# Patient Record
Sex: Female | Born: 2000 | Race: White | Hispanic: Yes | Marital: Single | Smoking: Never smoker
Health system: Southern US, Community
[De-identification: ages and names within clinical notes are randomized; demographics above are authoritative.]

## PROBLEM LIST (undated history)

## (undated) ENCOUNTER — Inpatient Hospital Stay (HOSPITAL_COMMUNITY): Payer: Self-pay

## (undated) DIAGNOSIS — F32A Depression, unspecified: Secondary | ICD-10-CM

## (undated) DIAGNOSIS — N390 Urinary tract infection, site not specified: Secondary | ICD-10-CM

## (undated) DIAGNOSIS — N760 Acute vaginitis: Secondary | ICD-10-CM

## (undated) DIAGNOSIS — B9689 Other specified bacterial agents as the cause of diseases classified elsewhere: Secondary | ICD-10-CM

## (undated) DIAGNOSIS — S42309A Unspecified fracture of shaft of humerus, unspecified arm, initial encounter for closed fracture: Secondary | ICD-10-CM

## (undated) DIAGNOSIS — A749 Chlamydial infection, unspecified: Secondary | ICD-10-CM

## (undated) HISTORY — PX: WISDOM TOOTH EXTRACTION: SHX21

## (undated) HISTORY — PX: TYMPANOSTOMY TUBE PLACEMENT: SHX32

## (undated) HISTORY — DX: Depression, unspecified: F32.A

---
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
## 2019-02-28 ENCOUNTER — Ambulatory Visit: Payer: Medicaid Other | Attending: Internal Medicine

## 2019-02-28 DIAGNOSIS — Z20822 Contact with and (suspected) exposure to covid-19: Secondary | ICD-10-CM | POA: Diagnosis not present

## 2019-03-01 LAB — NOVEL CORONAVIRUS, NAA: SARS-CoV-2, NAA: NOT DETECTED

## 2019-03-07 ENCOUNTER — Ambulatory Visit: Payer: Medicaid Other | Attending: Internal Medicine

## 2019-03-07 DIAGNOSIS — Z20822 Contact with and (suspected) exposure to covid-19: Secondary | ICD-10-CM

## 2019-03-08 LAB — NOVEL CORONAVIRUS, NAA: SARS-CoV-2, NAA: NOT DETECTED

## 2019-06-30 ENCOUNTER — Encounter: Payer: Self-pay | Admitting: Pediatrics

## 2019-06-30 ENCOUNTER — Other Ambulatory Visit (HOSPITAL_COMMUNITY)
Admission: RE | Admit: 2019-06-30 | Discharge: 2019-06-30 | Disposition: A | Discharge status: Home / Self Care | Payer: Medicaid Other | Source: Ambulatory Visit | Attending: Pediatrics | Admitting: Pediatrics

## 2019-06-30 ENCOUNTER — Ambulatory Visit (INDEPENDENT_AMBULATORY_CARE_PROVIDER_SITE_OTHER): Payer: Medicaid Other | Admitting: Pediatrics

## 2019-06-30 DIAGNOSIS — Z113 Encounter for screening for infections with a predominantly sexual mode of transmission: Secondary | ICD-10-CM | POA: Diagnosis not present

## 2019-06-30 DIAGNOSIS — N760 Acute vaginitis: Secondary | ICD-10-CM | POA: Diagnosis not present

## 2019-06-30 DIAGNOSIS — Z3202 Encounter for pregnancy test, result negative: Secondary | ICD-10-CM | POA: Diagnosis not present

## 2019-06-30 DIAGNOSIS — R300 Dysuria: Secondary | ICD-10-CM | POA: Diagnosis not present

## 2019-06-30 LAB — POCT URINALYSIS DIPSTICK
Bilirubin, UA: NEGATIVE
Glucose, UA: NEGATIVE
Ketones, UA: NEGATIVE
Nitrite, UA: NEGATIVE
Protein, UA: POSITIVE — AB
Spec Grav, UA: 1.01 (ref 1.010–1.025)
Urobilinogen, UA: 0.2 E.U./dL
pH, UA: 7 (ref 5.0–8.0)

## 2019-06-30 LAB — POCT URINE PREGNANCY: Preg Test, Ur: NEGATIVE

## 2019-06-30 MED ORDER — FLUCONAZOLE 150 MG PO TABS: 150.0000 mg | ORAL_TABLET | Freq: Once | ORAL | 0 refills | Status: AC

## 2019-06-30 NOTE — Progress Notes (Signed)
Subjective:     Theresa Hubbard, is a 18 y.o. female  HPI  Chief Complaint  Patient presents with  . Vaginal Itching    x 2 weeks    Current illness: Complains of burning and itching in vaginal area for 2 weeks Had painful in the course yesterday with small drops of serosanguineous discharge noted. Her typical discharge is thicker and more yellow with a small smell She does not have dysuria She has had the same sexual partner for 2 years her partner was checked and was negative for STIs about 2 weeks ago. She does not use condoms.  About 1 year ago she was treated twice for chlamydia the symptoms seem different than that time She also was pregnant and had a spontaneous abortion about 1 year ago.  She now has a Nexplanon  Fever: No  Vomiting: No Diarrhea: No Other symptoms such as sore throat or Headache?:  No No back pain Appetite  decreased?:  No Urine Output decreased?:  No  Review of Systems  History and Problem List: Theresa Hubbard has Acne vulgaris; Left carpal tunnel syndrome; and Irregular menstruation on their problem list.  Theresa Hubbard  has a past medical history of Broken arm, BV (bacterial vaginosis), Chlamydia, and UTI (urinary tract infection).  The following portions of the patient's history were reviewed and updated as appropriate: allergies, current medications, past family history, past medical history, past social history, past surgical history and problem list.     Objective:     There were no vitals taken for this visit.   Physical Exam Vitals and nursing note reviewed.  Constitutional:      General: She is not in acute distress. HENT:     Head: Normocephalic and atraumatic.     Right Ear: External ear normal.     Left Ear: External ear normal.     Nose: Nose normal.  Eyes:     General:        Right eye: No discharge.        Left eye: No discharge.     Conjunctiva/sclera: Conjunctivae normal.  Cardiovascular:     Rate and Rhythm:  Normal rate and regular rhythm.     Heart sounds: Normal heart sounds.  Pulmonary:     Effort: No respiratory distress.     Breath sounds: No wheezing or rales.  Abdominal:     General: There is no distension.     Palpations: Abdomen is soft.     Tenderness: There is no abdominal tenderness. There is no right CVA tenderness or left CVA tenderness.  Genitourinary:    Comments: Mild erythema over vulvar area no significant discharge noted, thin white plaque at introitus.  No papules no pustules no vesicles Musculoskeletal:     Cervical back: Normal range of motion.  Skin:    General: Skin is warm and dry.     Findings: No rash.        Assessment & Plan:   1. Acute vaginitis  Consider STI, yeast and bacterial vaginosis. She is well without fever.  There is no indication for hospitalization  - fluconazole (DIFLUCAN) 150 MG tablet; Take 1 tablet (150 mg total) by mouth once for 1 dose.  Dispense: 1 tablet; Refill: 0 - WET PREP BY MOLECULAR PROBE  2. Routine screening for STI (sexually transmitted infection)  - Urine cytology ancillary only  3. Dysuria  - POCT urinalysis dipstick--trace leukocytes - POCT urine pregnancy--negative  Treat empirically for yeast Discussed options to  treat empirically for STI.  She chooses to await results which is a reasonable option in this situation  Patient's phone number is [phone] for results  Supportive care and return precautions reviewed.  Spent  30  minutes completing face to face time with patient; counseling regarding diagnosis and treatment plan, chart review, care coordination and documentation.   Roselind Messier, MD

## 2019-07-01 LAB — URINE CYTOLOGY ANCILLARY ONLY
Chlamydia: NEGATIVE
Comment: NEGATIVE
Comment: NORMAL
Neisseria Gonorrhea: NEGATIVE

## 2019-07-01 LAB — WET PREP BY MOLECULAR PROBE
Candida species: NOT DETECTED
Gardnerella vaginalis: NOT DETECTED
MICRO NUMBER:: 10527361
SPECIMEN QUALITY:: ADEQUATE
Trichomonas vaginosis: NOT DETECTED

## 2019-07-01 NOTE — Progress Notes (Signed)
Called parent and reported lab results.

## 2019-07-01 NOTE — Progress Notes (Signed)
"  Result Notes and Comments to Patient Comment seen by patient Theresa Hubbard on 07/01/2019  9:27 AM EDT "

## 2019-08-05 ENCOUNTER — Ambulatory Visit (HOSPITAL_COMMUNITY)
Admission: EM | Admit: 2019-08-05 | Discharge: 2019-08-05 | Disposition: A | Discharge status: Home / Self Care | Payer: Medicaid Other | Attending: Emergency Medicine | Admitting: Emergency Medicine

## 2019-08-05 ENCOUNTER — Encounter (HOSPITAL_COMMUNITY): Payer: Self-pay

## 2019-08-05 ENCOUNTER — Other Ambulatory Visit: Payer: Self-pay

## 2019-08-05 DIAGNOSIS — L089 Local infection of the skin and subcutaneous tissue, unspecified: Secondary | ICD-10-CM | POA: Diagnosis not present

## 2019-08-05 DIAGNOSIS — L03115 Cellulitis of right lower limb: Secondary | ICD-10-CM | POA: Diagnosis not present

## 2019-08-05 DIAGNOSIS — S90812A Abrasion, left foot, initial encounter: Secondary | ICD-10-CM

## 2019-08-05 MED ORDER — CEPHALEXIN 500 MG PO CAPS: 500.0000 mg | ORAL_CAPSULE | Freq: Four times a day (QID) | ORAL | 0 refills | Status: DC

## 2019-08-05 NOTE — ED Provider Notes (Signed)
[facility]    CSN: [id number] Arrival date & time: 08/05/19  1104      History   Chief Complaint Chief Complaint  Patient presents with   Foot Injury    HPI Theresa Hubbard is a 19 y.o. female.   Pt was paddle boarding 3 days ago stepped onto clam shells , has cuts to bottom of rt heal, and top of left foot near 3rd toe. States they cleaned at the time with peroxide. Since then noticed redness pain and swelling. Has not treated since then.      Past Medical History:  Diagnosis Date   Broken arm    left   BV (bacterial vaginosis)    Chlamydia    UTI (urinary tract infection)     Patient Active Problem List   Diagnosis Date Noted   Irregular menstruation 03/15/2018   Left carpal tunnel syndrome 05/30/2016   Acne vulgaris 12/21/2013    Past Surgical History:  Procedure Laterality Date   TYMPANOSTOMY TUBE PLACEMENT     WISDOM TOOTH EXTRACTION      OB History    Gravida  1   Para      Term      Preterm      AB      Living        SAB      TAB      Ectopic      Multiple      Live Births               Home Medications    Prior to Admission medications   Medication Sig Start Date End Date Taking? Authorizing Provider  cephALEXin (KEFLEX) 500 MG capsule Take 1 capsule (500 mg total) by mouth 4 (four) times daily. 08/05/19   Coralyn Mark, NP  escitalopram (LEXAPRO) 10 MG tablet Take 1 tablet (10 mg total) by mouth daily. Patient not taking: Reported on 09/03/2018 06/17/18   [name], MD  polyethylene glycol powder [facility]) 17 GM/SCOOP powder Take 17 g by mouth daily. Patient not taking: Reported on 12/10/2018 09/03/18   [name], MD    Family History Family History  Problem Relation Age of Onset   Diabetes Mother    Asthma Sister     Social History Social History   Tobacco Use   Smoking status: Never Smoker   Smokeless tobacco: Never Used  Building services engineer Use: Never  used  Substance Use Topics   Alcohol use: Not Currently    Comment: only when traveling abroad   Drug use: Never     Allergies   Patient has no known allergies.   Review of Systems Review of Systems  Constitutional: Positive for fever.  Respiratory: Negative.   Cardiovascular: Negative.   Musculoskeletal: Positive for joint swelling.       Bil foot pain and cuts to feet with some redness   Skin: Positive for rash and wound.  Neurological: Negative.      Physical Exam Triage Vital Signs ED Triage Vitals  Enc Vitals Group     BP 08/05/19 1121 99/80     Pulse Rate 08/05/19 1121 69     Resp 08/05/19 1121 18     Temp 08/05/19 1121 99 F (37.2 C)     Temp Source 08/05/19 1121 Oral     SpO2 08/05/19 1121 99 %     Weight --      Height --  Head Circumference --      Peak Flow --      Pain Score 08/05/19 1119 6     Pain Loc --      Pain Edu? --      Excl. in GC? --    No data found.  Updated Vital Signs BP 99/80 (BP Location: Right Arm)    Pulse 69    Temp 99 F (37.2 C) (Oral)    Resp 18    SpO2 99%   Visual Acuity Right Eye Distance:   Left Eye Distance:   Bilateral Distance:    Right Eye Near:   Left Eye Near:    Bilateral Near:     Physical Exam Cardiovascular:     Rate and Rhythm: Normal rate.     Pulses: Normal pulses.  Pulmonary:     Effort: Pulmonary effort is normal.  Musculoskeletal:        General: Swelling, tenderness and signs of injury present.     Right lower leg: Edema present.     Left lower leg: Edema present.  Skin:    Findings: Erythema, lesion and rash present.     Comments: Skin abrasion small to rt heal and posterior lt foot near 3rd toe. No drainage, tenderness to touch slight erythema noted to both areas.   Neurological:     General: No focal deficit present.     Mental Status: She is alert.      UC Treatments / Results  Labs (all labs ordered are listed, but only abnormal results are displayed) Labs Reviewed - No  data to display  EKG   Radiology No results found.  Procedures Procedures (including critical care time)  Medications Ordered in UC Medications - No data to display  Initial Impression / Assessment and Plan / UC Course  I have reviewed the triage vital signs and the nursing notes.  Pertinent labs & imaging results that were available during my care of the patient were reviewed by me and considered in my medical decision making (see chart for details).    The areas are more abrasion nothing to suture Needs to wash and dry area  Will tx with abx to prevent any cellulitis  Take with foods   Final Clinical Impressions(s) / UC Diagnoses   Final diagnoses:  Infected abrasion of left foot, initial encounter  Cellulitis of right lower extremity     Discharge Instructions     The areas are more abrasion nothing to suture Needs to wash and dry area  Will tx with abx to prevent any cellulitis  Take with foods  Take tylenol or motrin as needed for pain     ED Prescriptions    Medication Sig Dispense Auth. Provider   cephALEXin (KEFLEX) 500 MG capsule Take 1 capsule (500 mg total) by mouth 4 (four) times daily. 20 capsule Coralyn Mark, NP     PDMP not reviewed this encounter.   Coralyn Mark, NP 08/05/19 1141

## 2019-08-05 NOTE — Discharge Instructions (Addendum)
The areas are more abrasion nothing to suture Needs to wash and dry area  Will tx with abx to prevent any cellulitis  Take with foods  Take tylenol or motrin as needed for pain

## 2019-08-05 NOTE — ED Triage Notes (Signed)
Pt presents with cuts on both feet X 3 days ago after [place].

## 2019-08-23 IMAGING — US US OB < 14 WEEKS - US OB TV
1 series · 15 of 28 positions shown · non-contrast
Comparison: None for this gestation

CLINICAL DATA: Positive pregnancy test, vaginal bleeding, abdominal
pain, Nexplanon in place; quantitative beta HCG 201; LMP 01/29/2018

EXAM:
OBSTETRIC <14 WK US AND TRANSVAGINAL OB US
TECHNIQUE: Both transabdominal and transvaginal ultrasound examinations were
performed for complete evaluation of the gestation as well as the
maternal uterus, adnexal regions, and pelvic cul-de-sac.
Transvaginal technique was performed to assess early pregnancy.

[Series 1: us ob < 14 weeks - us ob tv · 15 of 46 slices shown]
[im 1/46]
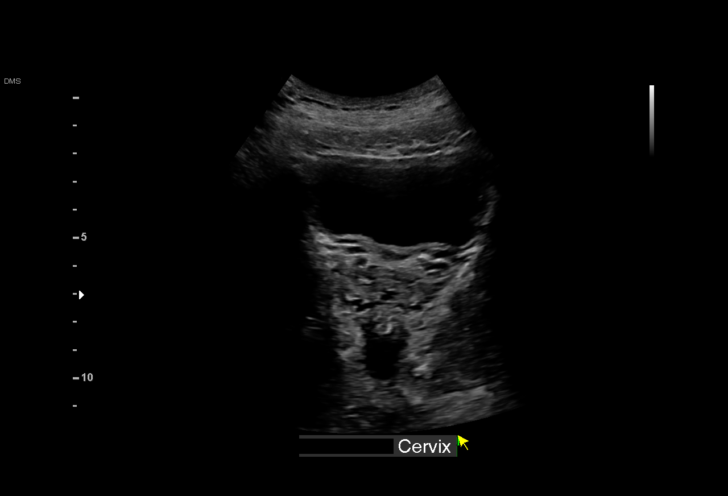
[im 4/46]
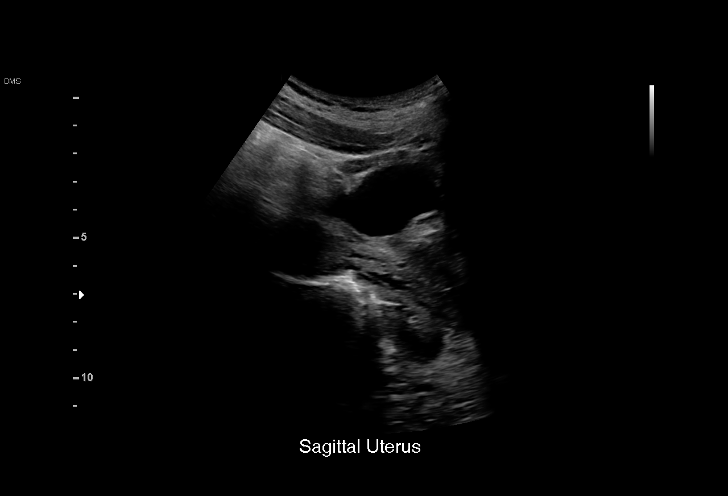
[im 7/46]
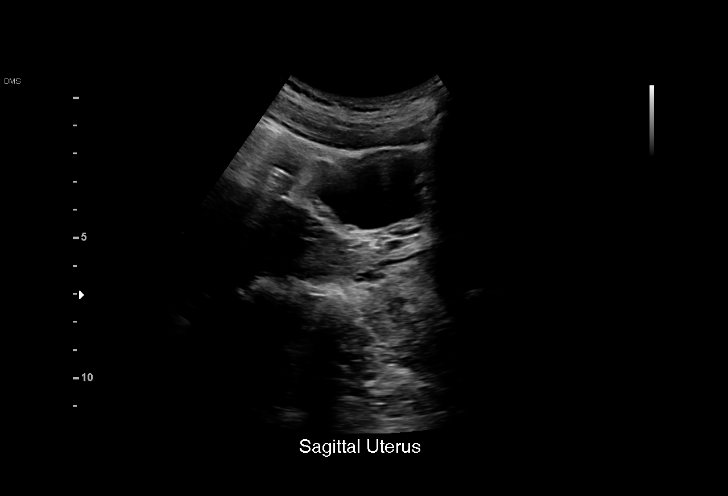
[im 11/46]
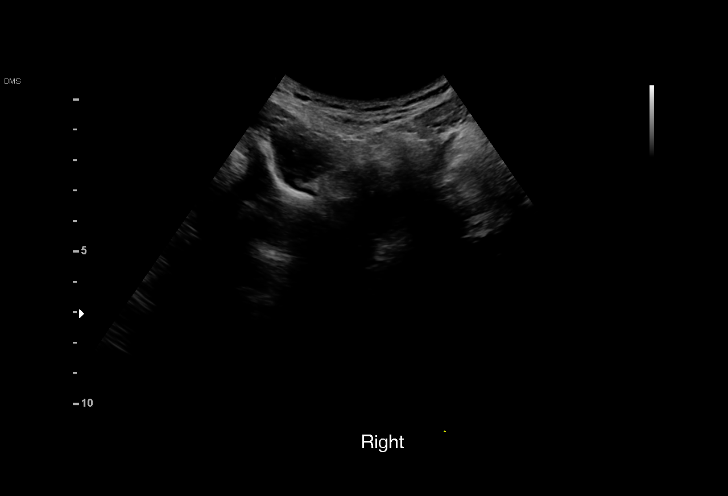
[im 14/46]
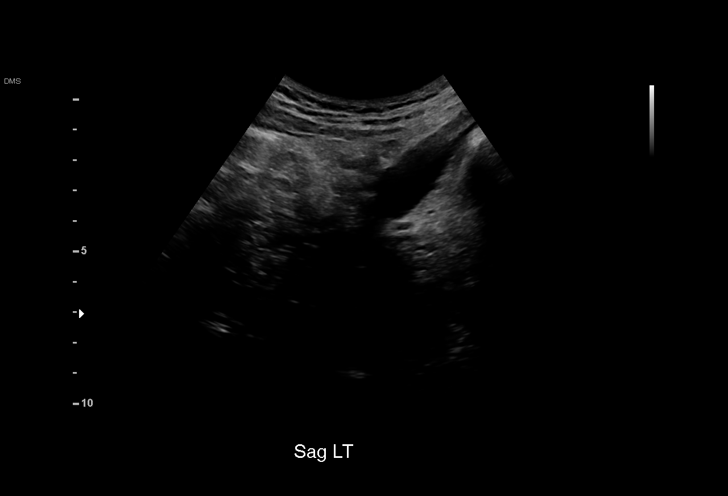
[im 17/46]
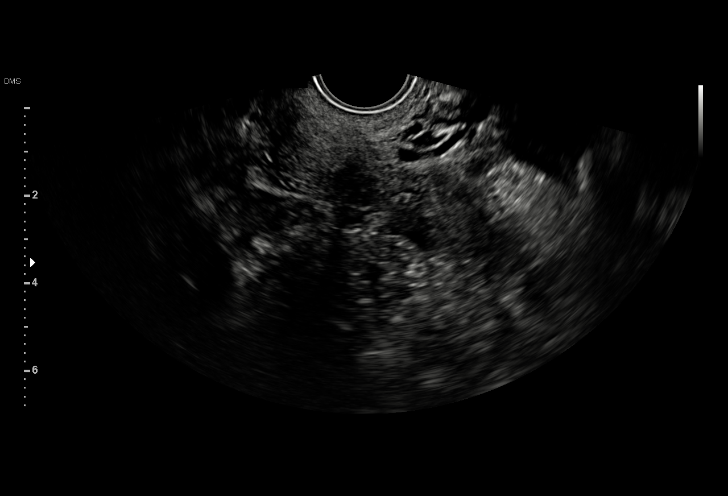
[im 21/46]
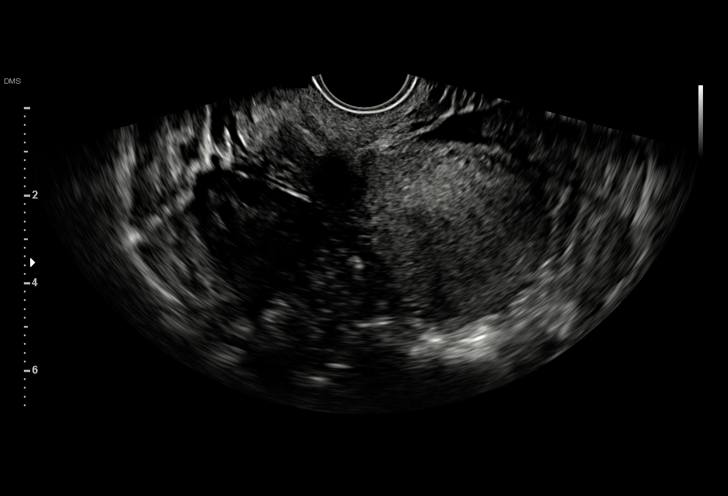
[im 24/46]
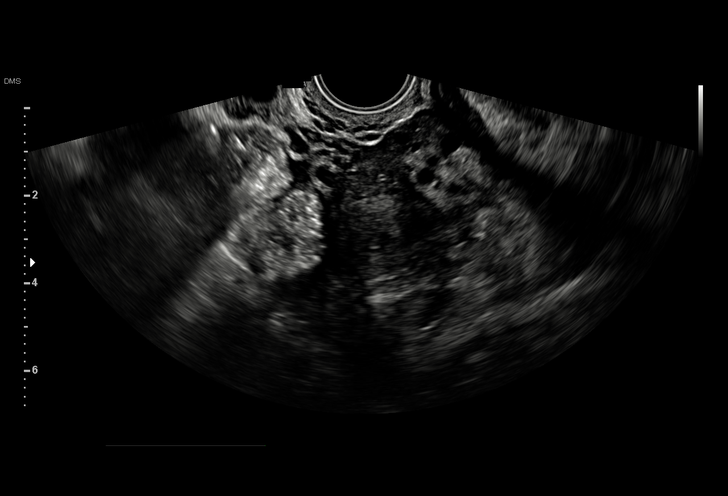
[im 26/46]
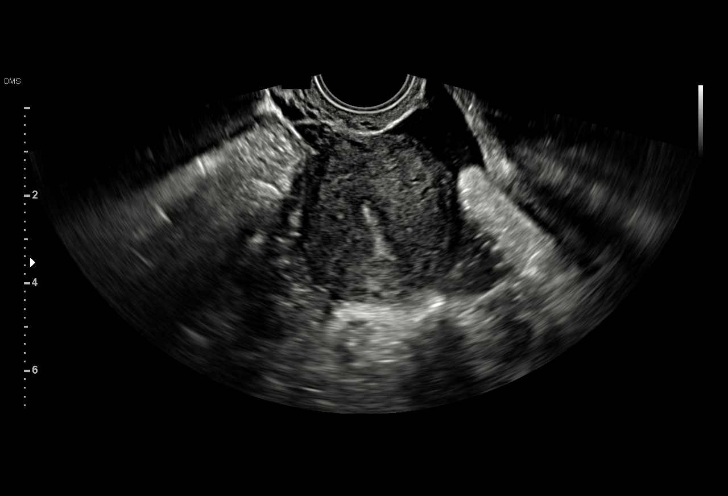
[im 29/46]
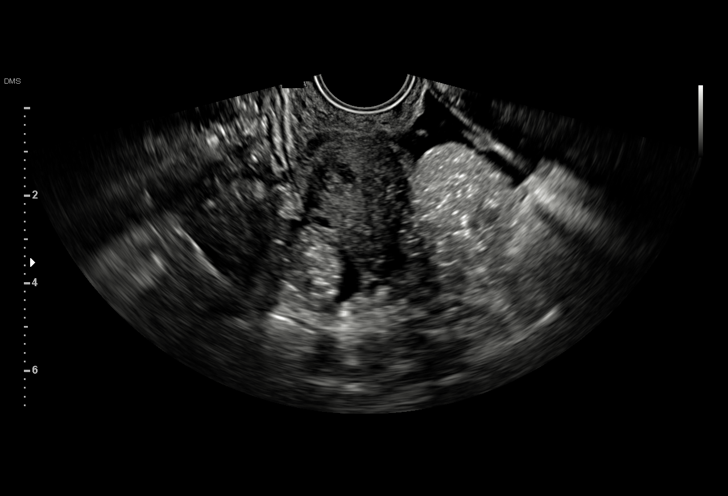
[im 32/46]
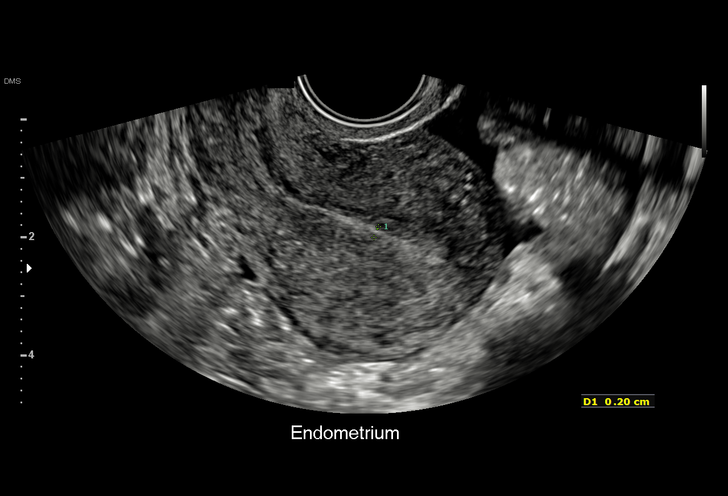
[im 36/46]
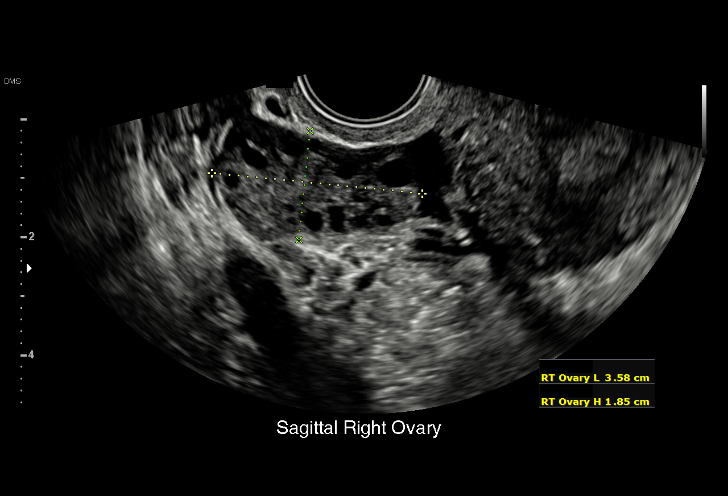
[im 39/46]
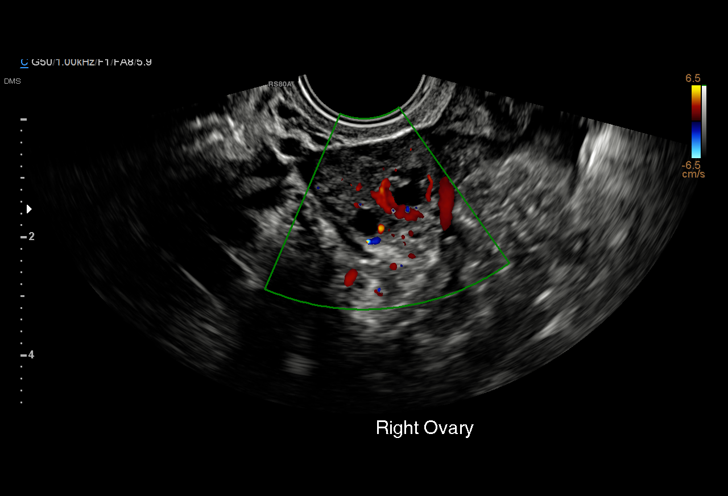
[im 42/46]
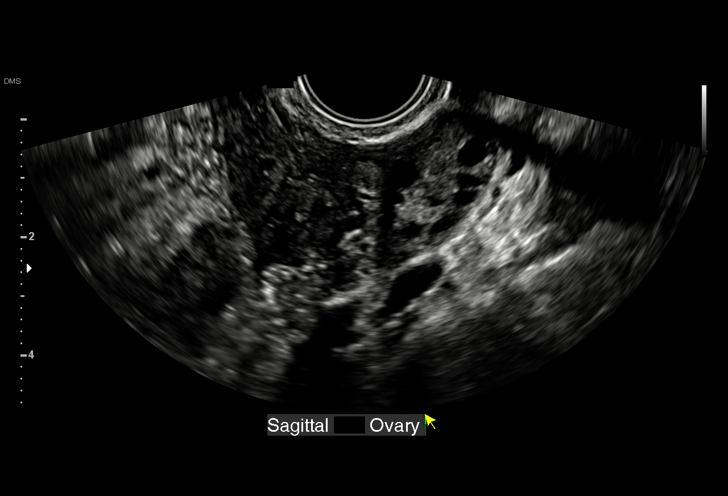
[im 46/46]
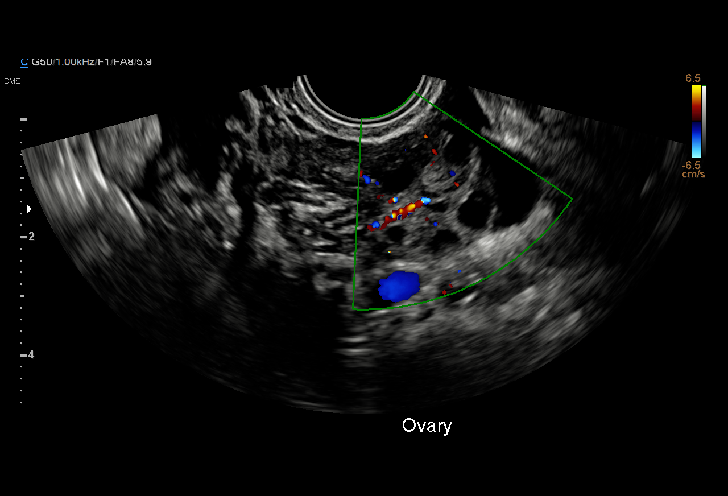

[15 of 28 positions shown; findings below may reference images not displayed]

FINDINGS: Intrauterine gestational sac: None identified

Yolk sac:  N/A

Embryo:  N/A

Cardiac Activity: N/A

Heart Rate: N/A  bpm

MSD:   mm    w     d

CRL:    mm    w    d                  US EDC:

Subchorionic hemorrhage:  N/A

Maternal uterus/adnexae:

Retroverted uterus without mass or gestational sac.

Small amount of nonspecific low-attenuation free pelvic fluid.

RIGHT ovary normal size and morphology 3.6 x 1.9 x 2.7 cm.

LEFT ovary normal size and morphology 3.2 x 2.0 x 2.3 cm.

Blood flow present within both ovaries on color Doppler imaging.

No adnexal masses.
IMPRESSION: No intrauterine gestation identified.

Findings are compatible with pregnancy of unknown location.

Differential diagnosis includes early intrauterine pregnancy too
early to visualize, spontaneous abortion, and ectopic pregnancy.

Serial quantitative beta hCG and or followup ultrasound recommended
to definitively exclude ectopic pregnancy.

## 2020-02-07 ENCOUNTER — Ambulatory Visit: Payer: Medicaid Other | Admitting: Pediatrics

## 2020-02-20 ENCOUNTER — Ambulatory Visit: Payer: Medicaid Other | Admitting: Pediatrics

## 2020-02-27 ENCOUNTER — Ambulatory Visit (INDEPENDENT_AMBULATORY_CARE_PROVIDER_SITE_OTHER): Payer: Medicaid Other | Admitting: Licensed Clinical Social Worker

## 2020-02-27 ENCOUNTER — Ambulatory Visit (INDEPENDENT_AMBULATORY_CARE_PROVIDER_SITE_OTHER): Payer: Medicaid Other | Admitting: Pediatrics

## 2020-02-27 ENCOUNTER — Encounter: Payer: Self-pay | Admitting: Pediatrics

## 2020-02-27 ENCOUNTER — Other Ambulatory Visit: Payer: Self-pay

## 2020-02-27 VITALS — BP 103/59 | HR 59 | Ht 64.67 in | Wt 161.2 lb

## 2020-02-27 DIAGNOSIS — F4321 Adjustment disorder with depressed mood: Secondary | ICD-10-CM

## 2020-02-27 DIAGNOSIS — Z975 Presence of (intrauterine) contraceptive device: Secondary | ICD-10-CM | POA: Diagnosis not present

## 2020-02-27 DIAGNOSIS — F329 Major depressive disorder, single episode, unspecified: Secondary | ICD-10-CM | POA: Diagnosis not present

## 2020-02-27 MED ORDER — ESCITALOPRAM OXALATE 10 MG PO TABS: 10.0000 mg | ORAL_TABLET | Freq: Every day | ORAL | 1 refills | Status: DC

## 2020-02-27 NOTE — Progress Notes (Signed)
History was provided by the patient.  Theresa Hubbard is a 20 y.o. female who is here for MDD, hx of STI, nexplanon.  Theresa Osgood, MD   HPI:  Pt reports that she has been more depressed and emotional for the last two years to see if things are better. She also feels like she has gained weight which she doesn't like. She doesn't have her periods regularly   She does feel like the covid situation messed her up as well, so maybe this isn't causal.   She is currently sexually active. She doesn't see boyfriend often but plans to use condoms.   She stopped taking lexapro because her dad doesn't believe in that stuff. He thought they were "drugs" so she stopped. She has had a lot of irritability and anger, makes a big deal when she shouldn't.   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 Working at [employer]   No LMP recorded. Patient has had an implant.  Review of Systems  Constitutional: Negative for malaise/fatigue.  Eyes: Negative for double vision.  Respiratory: Negative for shortness of breath.   Cardiovascular: Negative for chest pain and palpitations.  Gastrointestinal: Negative for abdominal pain, constipation, diarrhea, nausea and vomiting.  Genitourinary: Negative for dysuria.  Musculoskeletal: Negative for joint pain and myalgias.  Skin: Negative for rash.  Neurological: Negative for dizziness and headaches.  Endo/Heme/Allergies: Does not bruise/bleed easily.  Psychiatric/Behavioral: Positive for depression. Negative for suicidal ideas. The patient is nervous/anxious.     Patient Active Problem List   Diagnosis Date Noted  . Irregular menstruation 03/15/2018  . Left carpal tunnel syndrome 05/30/2016  . Acne vulgaris 12/21/2013    Current Outpatient Medications on File Prior to Visit  Medication Sig Dispense Refill  . cephALEXin (KEFLEX) 500 MG capsule Take 1 capsule (500 mg total) by mouth 4 (four) times daily. (Patient not  taking: Reported on 02/27/2020) 20 capsule 0  . escitalopram (LEXAPRO) 10 MG tablet Take 1 tablet (10 mg total) by mouth daily. (Patient not taking: No sig reported) 30 tablet 6  . polyethylene glycol powder (GLYCOLAX/MIRALAX) 17 GM/SCOOP powder Take 17 g by mouth daily. (Patient not taking: No sig reported) 500 g 12   No current facility-administered medications on file prior to visit.    No Known Allergies   Physical Exam:    Vitals:   02/27/20 1046  BP: (!) 103/59  Pulse: (!) 59  Weight: 161 lb 3.2 oz (73.1 kg)  Height: 5' 4.67" (1.643 m)    Growth percentile SmartLinks can only be used for patients less than 36 years old.  Physical Exam Vitals and nursing note reviewed.  Constitutional:      General: She is not in acute distress.    Appearance: She is well-developed.  Neck:     Thyroid: No thyromegaly.  Cardiovascular:     Rate and Rhythm: Normal rate and regular rhythm.     Heart sounds: No murmur heard.   Pulmonary:     Breath sounds: Normal breath sounds.  Abdominal:     Palpations: Abdomen is soft. There is no mass.     Tenderness: There is no abdominal tenderness. There is no guarding.  Musculoskeletal:     Right lower leg: No edema.     Left lower leg: No edema.  Lymphadenopathy:     Cervical: No cervical adenopathy.  Skin:    General: Skin is warm.     Findings: No rash.  Neurological:     Mental Status: She is alert.     Comments: No tremor     Assessment/Plan: 1. Nexplanon in place Willing to keep nexplanon today until next week and restart lexapro 10 mg to see how this affects her mood. She has had 1 TAB and does not wish to be pregnant again, so we discussed condoms and their limited efficacy for pregnancy prevention. I do not suspect the nexplanon is causing her mood symptoms, but more that she is having exacerbation of her underlying mental health issues with the pandemic.   2. Adjustment disorder with depressed mood Recommended restarting  lexapro 10 mg daily. Saw [facility] today who she had a previous relationship with. She will see Dahlia Client again next week.   Return in 1 week.   Alfonso Ramus, FNP

## 2020-02-27 NOTE — BH Specialist Note (Signed)
Integrated Behavioral Health Initial In-Person Visit  MRN: [id number] Name: Theresa Hubbard  Number of Integrated Behavioral Health Clinician visits:: 1/6 Session Start time: 11:20  Session End time: 11:55 Total time: 35  minutes  Types of Service: Individual psychotherapy  Interpretor:No. Interpretor Name and Language: n/a   Warm Hand Off Completed.       Subjective: Theresa Hubbard is a 20 y.o. female accompanied by Self Patient was referred by C. Maxwell Caul, NP for mood difficulties. Patient reports the following symptoms/concerns: Pt reports that mood continues to be a concern for her. Pt has hx of depressive symptoms. Pt would like to remove Nexplanon to see if that could improve her mood. Pt was prescribed but has not been taking lexapro, dad disapproved of medication. Pt is no longer living w/ parents, and is open to starting rx. Pt has moved and started a new job recently. Pt feels like counseling has been helpful in the past, would like to get connected to OPT. Duration of problem: years; Severity of problem: moderate  Objective: Mood: Depressed and Euthymic and Affect: Tearful Risk of harm to self or others: No plan to harm self or others  Life Context: Family and Social: Supportive family, lives w/ friend in [place], plans to move in with her boyfriend School/Work: Was enrolled at [employer], now is working full-time, will start next semester back at school Self-Care: Pt is interested in OPT, feels that talking with someone is helpful for her Life Changes: Moved out of parents' house, started new job, new relationship  Patient and/or Family's Strengths/Protective Factors: Social connections  Goals Addressed: Patient will: 1. Demonstrate ability to: Increase adequate support systems for patient/family and Improve medication compliance  Progress towards Goals: Ongoing  Interventions: Interventions utilized: Supportive Counseling, Psychoeducation  and/or Health Education, Link to [employer] and Supportive Reflection  Standardized Assessments completed: None at this time, PHQ-SADS at follow up  Patient and/or Family Response: Pt reports not being satisfied with her current mood, would like to make changes to change mood, including starting OPT, taking rx, and removing Nexplanon.  Assessment: Patient currently experiencing ongoing mood concerns.   Patient may benefit from following up w. Candida Peeling and [facility], as well as referral to OPT.  Plan: 1. Follow up with behavioral health clinician on : 03/08/20 2. Behavioral recommendations: Pt will start rx as prescribed; [facility] will place referral for OPT 3. Referral(s): Integrated Art gallery manager (In Clinic) and [facility] (LME/Outside Clinic) 4. "From scale of 1-10, how likely are you to follow plan?": Pt voiced understanding and agreement  Jama Flavors, [facility]

## 2020-02-29 DIAGNOSIS — Z975 Presence of (intrauterine) contraceptive device: Secondary | ICD-10-CM | POA: Insufficient documentation

## 2020-02-29 DIAGNOSIS — F4321 Adjustment disorder with depressed mood: Secondary | ICD-10-CM | POA: Insufficient documentation

## 2020-03-08 ENCOUNTER — Ambulatory Visit: Payer: Medicaid Other | Admitting: Licensed Clinical Social Worker

## 2020-03-08 ENCOUNTER — Telehealth: Payer: Self-pay | Admitting: *Deleted

## 2020-03-08 ENCOUNTER — Ambulatory Visit: Payer: Self-pay | Admitting: Pediatrics

## 2020-03-08 NOTE — Telephone Encounter (Signed)
Called patient and no answer. Left a message that we had to cancel appointment due to provider being out. Patient needs to be rescheduled.

## 2020-03-13 ENCOUNTER — Ambulatory Visit: Payer: Medicaid Other | Admitting: Licensed Clinical Social Worker

## 2020-03-13 ENCOUNTER — Ambulatory Visit: Payer: Medicaid Other | Admitting: Pediatrics

## 2020-03-14 ENCOUNTER — Encounter: Payer: Self-pay | Admitting: Pediatrics

## 2020-03-14 ENCOUNTER — Other Ambulatory Visit: Payer: Self-pay

## 2020-03-14 ENCOUNTER — Ambulatory Visit (INDEPENDENT_AMBULATORY_CARE_PROVIDER_SITE_OTHER): Payer: Medicaid Other | Admitting: Pediatrics

## 2020-03-14 ENCOUNTER — Ambulatory Visit (INDEPENDENT_AMBULATORY_CARE_PROVIDER_SITE_OTHER): Payer: Medicaid Other | Admitting: Licensed Clinical Social Worker

## 2020-03-14 VITALS — BP 102/61 | HR 81 | Ht 65.0 in | Wt 160.8 lb

## 2020-03-14 DIAGNOSIS — Z3046 Encounter for surveillance of implantable subdermal contraceptive: Secondary | ICD-10-CM

## 2020-03-14 DIAGNOSIS — F329 Major depressive disorder, single episode, unspecified: Secondary | ICD-10-CM

## 2020-03-14 NOTE — Patient Instructions (Signed)
Take plan B if needed. Come back and see [place] if you want to get back on a regular form of birth control!   Your Nexplanon was removed today and is no longer preventing pregnancy.  If you have sex, remember to use condoms to prevent pregnancy and to prevent sexually transmitted infections.  Leave the outside bandage on for 24 hours.  Leave the smaller bandages on for 3-5 days or until they fall off on their own.  Keep the area clean and dry for 3-5 days.  There is usually bruising or swelling at and around the removal site for a few days to a week after the removal.  If you see redness or pus draining from the removal site, call us immediately.  We would like you to return to the clinic for a follow-up visit in 1 month.  You can call [facility] 24 hours a day with any questions or concerns.  There is always a nurse or doctor available to take your call.  Call 9-1-1 if you have a life-threatening emergency.  For anything else, please call us at [phone] before heading to the ER.

## 2020-03-14 NOTE — Progress Notes (Signed)

## 2020-03-15 NOTE — BH Specialist Note (Signed)
Integrated Behavioral Health Follow Up In-Person Visit  MRN: [id number] Name: Theresa Hubbard  Number of Integrated Behavioral Health Clinician visits: 2/6 Session Start time: 3:55  Session End time: 4:15 Total time: 20 minutes  Types of Service: Individual psychotherapy  Interpretor:No. Interpretor Name and Language: n/a  Subjective: Theresa Hubbard is a 20 y.o. female accompanied by Self Patient was referred by C. Hacker for mood and adjustment concerns. Patient reports the following symptoms/concerns: Pt reports that she has been taking the lexapro as prescribed, feels that it has been helpful in stabilizing moods. Pt reports feeling good about decision to remove Nexplanon, and endorses back up contraceptive plan. Pt reports feeling good about her job, and proud of herself that she has been able to take care of herself and be more independent. Pt has recently moved in with boyfriend, and they are looking to move to [place] in the summer. Pt reports some stress around returning to school next semester, feels able to manage stress. Duration of problem: months to year; Severity of problem: moderate  Objective: Mood: Anxious, Depressed and Euthymic and Affect: Appropriate Risk of harm to self or others: No plan to harm self or others  Life Context: Family and Social: Lives w/ boyfriend, close with family School/Work: Working at [employer], will return to [employer] college in the fall Self-Care: Pt is taking rx as prescribed, referral placed for OPT (pt prefers virtual) Life Changes: Nexplanon removal, starting Lexapro, moving in w/ boyfriend  Patient and/or Family's Strengths/Protective Factors: Social connections, Sense of purpose and Physical Health (exercise, healthy diet, medication compliance, etc.)  Goals Addressed: Patient will: 1.  Reduce symptoms of: anxiety and depression  2.  Demonstrate ability to: Increase adequate support systems for  patient/family  Progress towards Goals: Ongoing  Interventions: Interventions utilized:  Supportive Counseling, Link to [employer] and Supportive Reflection Standardized Assessments completed: Not Needed  Patient and/or Family Response: Pt reports feeling good about where she is in life, is proud and optimistic about her ability to care for herself. Pt reports being open to OPT, but prefers virtual.  Assessment: Patient currently experiencing hx of MDD with anxiety symptoms, currently in remission, per pt's report.   Patient may benefit from continuing to take rx as prescribed, and following up w/ referral to OPT.  Plan: 1. Follow up with behavioral health clinician on : None scheduled 2. Behavioral recommendations: Pt will continue to follow medical recommendations 3. Referral(s): Community Mental Health Services (LME/Outside Clinic) 4. "From scale of 1-10, how likely are you to follow plan?": Pt voiced understanding and agreement  Jama Flavors, [facility]

## 2020-03-29 DIAGNOSIS — F4310 Post-traumatic stress disorder, unspecified: Secondary | ICD-10-CM | POA: Diagnosis not present

## 2020-03-29 DIAGNOSIS — F4323 Adjustment disorder with mixed anxiety and depressed mood: Secondary | ICD-10-CM | POA: Diagnosis not present

## 2020-11-10 ENCOUNTER — Ambulatory Visit (INDEPENDENT_AMBULATORY_CARE_PROVIDER_SITE_OTHER): Payer: Medicaid Other

## 2020-11-10 ENCOUNTER — Encounter: Payer: Self-pay | Admitting: *Deleted

## 2020-11-10 ENCOUNTER — Other Ambulatory Visit: Payer: Self-pay

## 2020-11-10 DIAGNOSIS — Z23 Encounter for immunization: Secondary | ICD-10-CM | POA: Diagnosis not present

## 2021-04-26 ENCOUNTER — Other Ambulatory Visit (HOSPITAL_COMMUNITY)
Admission: RE | Admit: 2021-04-26 | Discharge: 2021-04-26 | Disposition: A | Discharge status: Home / Self Care | Payer: Medicaid Other | Source: Ambulatory Visit | Attending: Pediatrics | Admitting: Pediatrics

## 2021-04-26 ENCOUNTER — Encounter: Payer: Self-pay | Admitting: Pediatrics

## 2021-04-26 ENCOUNTER — Ambulatory Visit (INDEPENDENT_AMBULATORY_CARE_PROVIDER_SITE_OTHER): Payer: Medicaid Other | Admitting: Pediatrics

## 2021-04-26 ENCOUNTER — Other Ambulatory Visit: Payer: Self-pay

## 2021-04-26 VITALS — Ht 64.8 in | Wt 176.0 lb

## 2021-04-26 DIAGNOSIS — F4322 Adjustment disorder with anxiety: Secondary | ICD-10-CM

## 2021-04-26 DIAGNOSIS — Z113 Encounter for screening for infections with a predominantly sexual mode of transmission: Secondary | ICD-10-CM | POA: Insufficient documentation

## 2021-04-26 MED ORDER — HYDROXYZINE HCL 10 MG PO TABS: 10.0000 mg | ORAL_TABLET | Freq: Three times a day (TID) | ORAL | 5 refills | Status: DC | PRN

## 2021-04-26 NOTE — Patient Instructions (Addendum)
COUNSELING AGENCIES in [place]  [facility] [phone] [address] [place], [place] [address] Urgent Care Services (ages 22 yo and up, available 24/7) Outpatient Counseling & Psychiatry (accepts people with no insurance, available during business hours)  Mental Health- Accepts Medicaid  (* = Spanish available;  + = Psychiatric services) * [facility]                            [phone] Virtual & Onsite  *+ [facility]:                                     [phone] or [phone] Virtual & Onsite  [facility]:                                              [phone] Virtual & Onsite  + [facility]:                                         [phone] Virtual & Onsite  [facility]                               [phone] Onsite  * [facility]:                                                   [phone]   [facility]                                                    [phone] Virtual & Onsite  [facility]           [phone] Virtual   [facility]:                                                           [phone] Virtual & Onsite  * [facility]:                                      [phone] Virtual & Onsite  [facility]:                            [phone]   *[facility]                                                  [phone] Virtual & Onsite  ?+ [facility]:             [phone] or [phone]   ?[facility]                                                 [phone] Virtual & Onsite  ? ? ?Mental Health Apps & Websites 2016 ? ?Better Sleep - Soothing sounds ? ?Healthy Minds ?a.  HealthyMinds is a problem-solving tool to help deal with emotions and cope with the stresses students encounter both on and off campus.  ? ?MindShift: Tools for anxiety  management, from Anxiety ? ?Stop Breathe & Think: Mindfulness for teens ?a. A friendly, simple tool to guide people of all ages and backgrounds through meditations for mindfulness and compassion. ? ?Smiling Mind: Mindfulness app from [place] (http://smilingmind.com.au/) ?a. Smiling Mind is a unique Clinical biochemist program developed by a team of psychologists with expertise in youth and adolescent therapy, Mindfulness Meditation and web-based wellness programs ? ? ?TeamOrange - This is a pretty unique website and app developed by a youth, to support other youth around bullying and stress management  ? ? ? My Life My Voice  ?a. How are you feeling? This mood journal offers a simple solution for tracking your thoughts, feelings and moods in this interactive tool you can keep right on your phone! ? ?[employer], developed by the [facility] ?[facility]), is part of Dialectical Behavior Therapy treatment for Veterans. This could be helpful for adolescents with a pending stressful transition such as a move or going off  ?to college ? ? ?MY3 (jiezhoufineart.com ?a. MY3 features a support system, safety plan and resources with the goal of giving clients a tool to use in a time of need. ?National Suicide Prevention Lifeline (308)876-4960.TALK [8255]) and 911 are there to help them. ? ?ReachOut.com (http://us.MenusLocal.com.br) ?a. ReachOut is an information and support service using evidence based principles and  technology to help teens and young adults facing tough times and struggling with  mental health issues. All content is written by teens and young adults, for teens  and young adults, to meet them where they are, and help them recognize their  own strengths and use those strengths to overcome their difficulties and/or seek  help if necessary.  ?

## 2021-04-26 NOTE — Progress Notes (Signed)
?  Subjective:  ?  ?Theresa Hubbard is a 21 y.o. old female here by herself for No chief complaint on file. ?.   ? ?HPI ?Was scheduled as a PE -  ?Planning to move apartments and would like disability forms filled out to override the apartment animal policies ?Had a LCSW in [place] do a virtual visit last year and write a letter for the current apartment ?Will be moving ? ?Siginificant feelings of sadness ?Worries about a lot of things ?Feels like "life is moving too fast" ?Finds time with the dog helpful ?Working at [employer] call center currently ?No current therapist  ?No meds currently - "but I probably need to be" ?Occasional panic attacks ?Some trouble sleeping at night ? ?One current sexual partner -  ?Not on contraception currently ?Use condoms ? ?Review of Systems  ?Constitutional:  Negative for activity change, appetite change and unexpected weight change.  ?Genitourinary:  Negative for dyspareunia and dysuria.  ? ?   ?Objective:  ?  ?Ht 5' 4.8" (1.646 m)   Wt 176 lb (79.8 kg)   BMI 29.47 kg/m?  ?Physical Exam ?Constitutional:   ?   Appearance: Normal appearance.  ?Cardiovascular:  ?   Rate and Rhythm: Normal rate and regular rhythm.  ?Pulmonary:  ?   Effort: Pulmonary effort is normal.  ?   Breath sounds: Normal breath sounds.  ?Abdominal:  ?   Palpations: Abdomen is soft.  ?Neurological:  ?   Mental Status: She is alert.  ? ? ?   ?Assessment and Plan:  ?   ?Theresa Hubbard was seen today for No chief complaint on file. ?. ?  ?Problem List Items Addressed This Visit   ?None ?Visit Diagnoses   ? ? Adjustment disorder with anxiety    -  Primary  ? Routine screening for STI (sexually transmitted infection)      ? Relevant Orders  ? Urine cytology ancillary only (Completed)  ? ?  ? ?Significant feelings of anxiousness and sadness - will refer back to adolescent medicine for management and consider SSRI. In the meantime, rx for hydroxyzine to use for panic attacks and sleep as needed.  ?Gave information to get set up  with a therapist.  ?In the meantime, made appt for her to see [facility] here.  ? ?Since she is not currently seeing [place] for mental health concerns, declined to fill out the support animal paperwork. She will contact the person that wrote her a letter previously.  ? ?Also reviewed contraception and options. Can also discuss with adolescent medicine.  ?Urine GC/CT testing sent today.  ? ?Time spent reviewing chart in preparation for visit: 5 minutes ?Time spent face-to-face with patient: 20 minutes ?Time spent not face-to-face with patient for documentation and care coordination on date of service: 5 minutes  ? ?No follow-ups on file. ? ?Royston Cowper, MD ? ?   ? ? ? ? ?

## 2021-04-29 LAB — URINE CYTOLOGY ANCILLARY ONLY
Chlamydia: POSITIVE — AB
Comment: NEGATIVE
Comment: NEGATIVE
Comment: NORMAL
Neisseria Gonorrhea: NEGATIVE
Trichomonas: NEGATIVE

## 2021-04-30 ENCOUNTER — Ambulatory Visit (INDEPENDENT_AMBULATORY_CARE_PROVIDER_SITE_OTHER): Payer: Medicaid Other | Admitting: Licensed Clinical Social Worker

## 2021-04-30 ENCOUNTER — Ambulatory Visit (INDEPENDENT_AMBULATORY_CARE_PROVIDER_SITE_OTHER): Payer: Medicaid Other | Admitting: Pediatrics

## 2021-04-30 ENCOUNTER — Other Ambulatory Visit: Payer: Self-pay

## 2021-04-30 DIAGNOSIS — F4323 Adjustment disorder with mixed anxiety and depressed mood: Secondary | ICD-10-CM

## 2021-04-30 DIAGNOSIS — A749 Chlamydial infection, unspecified: Secondary | ICD-10-CM | POA: Diagnosis not present

## 2021-04-30 NOTE — Progress Notes (Signed)
?  Subjective:  ?  ?Theresa Hubbard is a 21 y.o. old female here  for No chief complaint on file. ?.   ? ?HPI ?Tested positive for chlamydia at appointment last week  ? ?Happens to be in clinic today at [facility] visit ? ?Some questions about chlamydia ? ?Only one partner for more than a year ?Not consistent with condoms ? ?Has upcoming appt to discuss contraception ? ?Review of Systems  ?Constitutional:  Negative for activity change, appetite change and unexpected weight change.  ?Genitourinary:  Negative for decreased urine volume, dyspareunia, pelvic pain and vaginal pain.  ? ?   ?Objective:  ?  ?There were no vitals taken for this visit. ?Physical Exam ?Constitutional:   ?   Appearance: Normal appearance.  ?Cardiovascular:  ?   Rate and Rhythm: Normal rate and regular rhythm.  ?Pulmonary:  ?   Effort: Pulmonary effort is normal.  ?   Breath sounds: Normal breath sounds.  ?Abdominal:  ?   Palpations: Abdomen is soft.  ?Neurological:  ?   Mental Status: She is alert.  ? ? ?   ?Assessment and Plan:  ?   ?Theresa Hubbard was seen today for No chief complaint on file. ?. ?  ?Problem List Items Addressed This Visit   ?None ?Visit Diagnoses   ? ? Chlamydia    -  Primary  ? ?  ? ?Chlamydia - 1 g of azithromycin provided in clinic today. Reviewed mechanism of transition. Encouraged condom use.  ? ?Plan rescreen in 3 months ? ?Time spent reviewing chart in preparation for visit: 2 minutes ?Time spent face-to-face with patient: 15 minutes ?Time spent not face-to-face with patient for documentation and care coordination on date of service: 2 minutes  ? ?No follow-ups on file. ? ?Dory Peru, MD ? ?   ? ? ? ? ?

## 2021-04-30 NOTE — BH Specialist Note (Signed)
Integrated Behavioral Health Initial In-Person Visit ? ?MRN: [id number] ?Name: Theresa Hubbard ? ?Number of Integrated Behavioral Health Clinician visits: 1/6 ?Session Start time: 9:32AM   ?Session End time: 10:28AM ?Total time in minutes: 56 MINS  ? ?Types of Service: Individual psychotherapy ? ?Interpretor:No. Interpretor Name and Language: None  ? ? Warm Hand Off Completed. ?  ? ?  ? ? ?Subjective: ?Theresa Hubbard is a 21 y.o. female accompanied by  Self ?Patient was referred by Cephus Slater for Anxiety and depression symptoms. ?Patient reports the following symptoms/concerns: worries, feeling sad and financial concerns.  ?Duration of problem: Months; Severity of problem: moderate ? ?Objective: ?Mood: Anxious and Affect: Appropriate ?Risk of harm to self or others: No plan to harm self or others ? ?Life Context: ?Family and Social: Pt lives with boyfriend.  ?School/Work: Work at [employer]  ?Self-Care: spending time with her dog, spending time with cousins and playing videogames.  ?Life Changes: Had a miscarriage in 2022. Moving out with boyfriend  ? ?Patient and/or Family's Strengths/Protective Factors: ?Social and Emotional competence, Concrete supports in place (healthy food, safe environments, etc.), and Physical Health (exercise, healthy diet, medication compliance, etc.) ? ?Goals Addressed: ?Patient will: ?Increase knowledge and/or ability of: coping skills and healthy habits  ?Demonstrate ability to: Increase healthy adjustment to current life circumstances and Increase adequate support systems for patient/family ? ?Progress towards Goals: ?Ongoing ? ?Interventions: ?Interventions utilized: CBT Cognitive Behavioral Therapy, Supportive Counseling, Psychoeducation and/or Health Education, and Supportive Reflection  ?Standardized Assessments completed: Not Needed ? ?Patient and/or Family Response: Pt shared psychosocial stressors, regarding relationship with boyfriend, family, work and career.  Pt shared struggles with sleep due to thoughts of not waking up. Pt reports feeling like her life is limited and feeling as if she has to rush life. ?[facility] educated pt on sleep hygiene.  ?[facility] engaged in cognitive restructuring in processing challenging, changing, irrational thoughts. [facility] provided education on how our thoughts determine how we feel and how we act. [facility] explored with pt ways that we are able to challenge our negative thoughts.  ? ?Patient Centered Plan: ?Patient is on the following Treatment Plan(s):  Anxiety and Depression ? ? ?Assessment: ?Patient currently experiencing anxiety and depressive symptoms stemming from environmental and psychosocial stressors.  ?  ?Patient may benefit from continued support of this clinic, challenging irrational thoughts and utilizing coping skills. ? ?Plan: ?Follow up with behavioral health clinician on : 05/16/21 at 9:30am ?Behavioral recommendations: Pt will create a sleep routine to assist with better sleep. Dinner, Phone time, TV Time, Air traffic controller and Bedtime. Pt will also start yoga and stretching to release muscle tension. Pt will also combat negative thoughts.  ?Referral(s): Integrated [employer] (In Clinic) ?"From scale of 1-10, how likely are you to follow plan?": Pt agreed with plan above.  ? ?Ashlon Lottman Cruzita Lederer, LCSWA ? ? ? ? ? ? ? ? ?

## 2021-05-16 ENCOUNTER — Encounter: Payer: Self-pay | Admitting: Pediatrics

## 2021-05-16 ENCOUNTER — Other Ambulatory Visit (HOSPITAL_COMMUNITY)
Admission: RE | Admit: 2021-05-16 | Discharge: 2021-05-16 | Disposition: A | Discharge status: Home / Self Care | Payer: Medicaid Other | Source: Ambulatory Visit | Attending: Pediatrics | Admitting: Pediatrics

## 2021-05-16 ENCOUNTER — Ambulatory Visit (INDEPENDENT_AMBULATORY_CARE_PROVIDER_SITE_OTHER): Payer: Medicaid Other | Admitting: Licensed Clinical Social Worker

## 2021-05-16 ENCOUNTER — Ambulatory Visit (INDEPENDENT_AMBULATORY_CARE_PROVIDER_SITE_OTHER): Payer: Medicaid Other | Admitting: Pediatrics

## 2021-05-16 VITALS — BP 99/59 | HR 62 | Ht 65.35 in | Wt 176.0 lb

## 2021-05-16 DIAGNOSIS — Z113 Encounter for screening for infections with a predominantly sexual mode of transmission: Secondary | ICD-10-CM | POA: Diagnosis not present

## 2021-05-16 DIAGNOSIS — G479 Sleep disorder, unspecified: Secondary | ICD-10-CM

## 2021-05-16 DIAGNOSIS — K5900 Constipation, unspecified: Secondary | ICD-10-CM | POA: Diagnosis not present

## 2021-05-16 DIAGNOSIS — Z7251 High risk heterosexual behavior: Secondary | ICD-10-CM | POA: Diagnosis not present

## 2021-05-16 DIAGNOSIS — Z3202 Encounter for pregnancy test, result negative: Secondary | ICD-10-CM | POA: Diagnosis not present

## 2021-05-16 DIAGNOSIS — Z3002 Counseling and instruction in natural family planning to avoid pregnancy: Secondary | ICD-10-CM

## 2021-05-16 DIAGNOSIS — F4323 Adjustment disorder with mixed anxiety and depressed mood: Secondary | ICD-10-CM

## 2021-05-16 HISTORY — DX: Constipation, unspecified: K59.00

## 2021-05-16 HISTORY — DX: Sleep disorder, unspecified: G47.9

## 2021-05-16 LAB — POCT URINE PREGNANCY: Preg Test, Ur: NEGATIVE

## 2021-05-16 MED ORDER — LEVONORGESTREL 1.5 MG PO TABS
1.5000 mg | ORAL_TABLET | Freq: Once | ORAL | Status: AC
  Administered 2021-05-16: 1.5 mg via ORAL

## 2021-05-16 MED ORDER — ESCITALOPRAM OXALATE 10 MG PO TABS: 10.0000 mg | ORAL_TABLET | Freq: Every day | ORAL | 1 refills | Status: DC

## 2021-05-16 MED ORDER — POLYETHYLENE GLYCOL 3350 17 GM/SCOOP PO POWD: 17.0000 g | Freq: Every day | ORAL | 6 refills | Status: DC

## 2021-05-16 MED ORDER — DOCUSATE SODIUM 100 MG PO CAPS: 100.0000 mg | ORAL_CAPSULE | Freq: Two times a day (BID) | ORAL | 3 refills | Status: DC

## 2021-05-16 NOTE — Progress Notes (Signed)
History was provided by the patient. ? ?Theresa Hubbard is a 21 y.o. female who is here for anxiety, depression, contraception.  ?Dillon Bjork, MD  ? ?HPI:  Pt reports she is back today- tested positive for chlamydia- she was really surprised. Treated in clinic. Boyfriend got treated at the health department and they waited to have sex appropriately.  ? ?She previously had nexplanon but became depressed and gained weight (thinks likely related to covid). After removal she was happy to let her body do its natural thing. She is nervous about an IUD d/t pain with placement. Feels like she won't remember to take a pill. Still feels skeptical of starting anything. Also worried about infertility. Having regular periods now although she is having some spotting between period right now. LMP started 3/27. Last sexually active yesterday, never using condoms, using pull out method. Boyfriend encouraged her to get plan B today.  ? ?Planning to move out from boyfriend and move in with cousin which is closer to her job and hopes it will improve relationship with boyfriend. She would like to be married by 24 and have children after that. Worries about having kids and bringing them into the world that feels very scary to her right now. Also worries about infertility after her miscarriage in 2020.  ? ?Took lexapro for a while- maybe a few months. and feels like it did help her some but felt like it didn't help as much as it could have. Picked up hydroxyzine and has taken it intermittently. Feeling tired and low motivation. 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 Wants to be alone more, isolating. Not sleeping well at night. Some days she is tired and goes to sleep but some days feels panicked and can't sleep. Having some forgetfulness. Meeting with [facility] today. Would like to get established with ongoing counseling- used to see someone virtually from [place] but he doesn't have availability right now.   ? ?Hasn't been pooping much. Having more bloating. Having some blood when she wipes with stools. Drinking a lot more soda instead of water. At night she is thirsty an drinks a lot of water. Not eating a ton of fruits and veggies. Has not tried any medications or interventions.  ? ? ?  05/16/2021  ? 10:15 AM 02/18/2018  ?  4:00 PM  ?PHQ-SADS Last 3 Score only  ?PHQ-15 Score 16   ?Total GAD-7 Score 16   ?PHQ Adolescent Score 20 14  ?  ? ? ?Patient's last menstrual period was 04/29/2021. ? ? ?Patient Active Problem List  ? Diagnosis Date Noted  ? Constipation 05/16/2021  ? Nexplanon in place 02/29/2020  ? Adjustment disorder with depressed mood 02/29/2020  ? Irregular menstruation 03/15/2018  ? Left carpal tunnel syndrome 05/30/2016  ? Acne vulgaris 12/21/2013  ? ? ?Current Outpatient Medications on File Prior to Visit  ?Medication Sig Dispense Refill  ? hydrOXYzine (ATARAX) 10 MG tablet Take 1 tablet (10 mg total) by mouth every 8 (eight) hours as needed for anxiety. 30 tablet 5  ? ?No current facility-administered medications on file prior to visit.  ? ? ?No Known Allergies ?Physical Exam:  ?  ?Vitals:  ? 05/16/21 0855  ?BP: (!) 99/59  ?Pulse: 62  ?Weight: 176 lb (79.8 kg)  ?Height: 5' 5.35" (1.66 m)  ? ? ?Growth percentile SmartLinks can only be used for patients less than 44 years old. ? ?Physical Exam ?Vitals and nursing note reviewed.  ?Constitutional:   ?  General: She is not in acute distress. ?   Appearance: She is well-developed.  ?Neck:  ?   Thyroid: No thyromegaly.  ?Cardiovascular:  ?   Rate and Rhythm: Normal rate and regular rhythm.  ?   Heart sounds: No murmur heard. ?Pulmonary:  ?   Breath sounds: Normal breath sounds.  ?Abdominal:  ?   Palpations: Abdomen is soft. There is no mass.  ?   Tenderness: There is no abdominal tenderness. There is no guarding.  ?Musculoskeletal:  ?   Right lower leg: No edema.  ?   Left lower leg: No edema.  ?Lymphadenopathy:  ?   Cervical: No cervical adenopathy.  ?Skin: ?    General: Skin is warm.  ?   Findings: No rash.  ?Neurological:  ?   Mental Status: She is alert.  ?   Comments: No tremor  ?Psychiatric:     ?   Mood and Affect: Mood is anxious. Affect is tearful.  ? ? ?Assessment/Plan: ?1. Adjustment disorder with mixed anxiety and depressed mood ?Having ongoing severe anxiety and depression sx with intrusive thoughts. Having hypervigilance around being anxious about mass shootings that have happened. Discussed at length and meeting with [facility] today. Willing to restart lexapro 10 mg daily. Will continue hydroxyzine PRN and at bedtime for sleep. Expect sleep to improve with decreasing anxiety sx over time. Will benefit from connection to ongoing counseling.  ?- escitalopram (LEXAPRO) 10 MG tablet; Take 1 tablet (10 mg total) by mouth daily.  Dispense: 90 tablet; Refill: 1 ? ?2. Constipation, unspecified constipation type ?Will do a constipation cleanout and then colace BID to help with stooling patterns. Discussed also increasing water intake and fruits and veggies as she is able once she moves in with cousin and has more funds available. Will also get TFTs given constipation and significant mood sx to rule out any thyroid disorder.  ?- docusate sodium (COLACE) 100 MG capsule; Take 1 capsule (100 mg total) by mouth 2 (two) times daily.  Dispense: 60 capsule; Refill: 3 ?- polyethylene glycol powder (GLYCOLAX/MIRALAX) 17 GM/SCOOP powder; Take 17 g by mouth daily.  Dispense: 578 g; Refill: 6 ?- TSH ?- T4, free ? ?3. Encounter for counseling and instruction in natural family planning to avoid pregnancy ?Discussed contraception options. Still feels worried about hormonal contraception. Discussed period tracking to provide some data about times to avoid if she is not wanting to currently be pregnant.  ? ?4. Unprotected sex ?Plan B today here . ?- levonorgestrel (PLAN B 1-STEP) tablet 1.5 mg ? ?5. Routine screening for STI (sexually transmitted infection) ?Will re-screen- still may be  positive but has been almost 3 weeks  ?- Urine cytology ancillary only ? ?6. Pregnancy examination or test, negative result ?Neg, will repeat at next visit.  ?- POCT urine pregnancy ? ?Return in 3 weeks  ? ?Jonathon Resides, FNP ? ?I spent >40 minutes spent face to face with patient with more than 50% of appointment spent discussing diagnosis, management, follow-up, and reviewing of anxiety, depression, intrusive thoughts, sleep, contraception, constipation. I spent an additional 5 minutes on pre-and post-visit activities.  ? ? ? ?

## 2021-05-16 NOTE — Patient Instructions (Addendum)
Eat more fruits and veggies  ?Drink more water  ?Start colace 100 mg twice daily for constipation  ?Restart lexapro 10 mg daily  ?Take hydroxyzine 1-2 tablets at bedtime for sleep  ?Period tracking apps- Spot On, Flow ? ?Miralax cleanout  ? Constipation Action Plan  ? ?CLEANING OUT THE POOP( takes several days and may need to be repeated)  ? ?Your doctor has marked the medicine your child needs on the list below:  ? ?16 capfuls of Miralax mixed in 64 ounces of water, juice or Gatorade   ?Make sure all of this mixture is gone within 2 hours  ?1 chocolate Ex-lax square or 1 teaspoon of senna liquid  ?Take this amount 1 time each day for 3-5 days  ? ? ?When should my child start the medicine?  ?Start the medicine on Friday afternoon or some other time when your child will be out of school and at home for a couple of days.  By the end of the 2nd day your child's poop should be liquid and almost clear, like [facility].  ? ?Will my child have any problems with the medicine?  ?Often children have stomach pain or cramps with this medicine. This pain may mean that your child needs to poop. Have your child sit on the toilet with their favorite book.  ? ?What else can I do to help my child?  ?Have your child sit on the toilet for 5-10 minutes after each meal.  Do not worry if your child does not poop. In a few weeks the colon muscle will get stronger and the urge to poop will begin to feel more normal. Tell your child that they did a good job trying to poop.   ?

## 2021-05-16 NOTE — BH Specialist Note (Addendum)
Integrated Behavioral Health Follow Up In-Person Visit ? ?MRN: [id number] ?Name: Theresa Hubbard ? ?Number of Integrated Behavioral Health Clinician visits: 2/6 ?Session Start time: 9:47AM  ?Session End time: 10:38AM ?Total time in minutes: 51 MINS ? ?Types of Service: Individual psychotherapy ? ?Interpretor:No. Interpretor Name and Language: none  ? ?Subjective: ?Theresa Hubbard is a 21 y.o. female accompanied by  self ?Patient was referred by Cephus Slater for anxiety and depressive symptoms. ?Patient reports the following symptoms/concerns: worries, feeling sad and financial concerns.  ?Duration of problem: months; Severity of problem: moderate ? ?Objective: ?Mood: Anxious and Affect: Appropriate ?Risk of harm to self or others: No plan to harm self or others ? ?Life Context: ?Family and Social: Pt lives with boyfriend.  ?School/Work: Work at [employer] full time.  ?Self-Care: spending time with her dog, spending time with cousins and playing videogames.  ?Life Changes: Had a miscarriage in 2022. Moving out with boyfriend in a few weeks.  ? ?Patient and/or Family's Strengths/Protective Factors: ?Social and Emotional competence, Concrete supports in place (healthy food, safe environments, etc.), and Sense of purpose ? ?Goals Addressed: ?Patient will: ? Increase knowledge and/or ability of: coping skills and healthy habits  ? Demonstrate ability to: Increase healthy adjustment to current life circumstances and Increase adequate support systems for patient/family ? ?Progress towards Goals: ?Ongoing ? ?Interventions: ?Interventions utilized:  Copywriter, advertising, Supportive Counseling, Psychoeducation and/or Health Education, Supportive Reflection, and Guided Imagery ?Standardized Assessments completed: Not Needed (completed with provider)  ? ? ?  05/16/2021  ? 10:15 AM 02/18/2018  ?  4:00 PM  ?PHQ-SADS Last 3 Score only  ?PHQ-15 Score 16   ?Total GAD-7 Score 16   ?PHQ Adolescent Score 20 14   ?  ? ?Patient and/or Family Response: Pt reports being very forgetful lately due to increase stressors. Pt reports problems with sleeping at night due to racing thoughts. Pt reports being stressed about money, not being able to afford certain things and being in debt. Pt reports excessive thoughts worrying about how scary the world is, what can potentially happen to her if she's out in public and thinking about her safety. [facility] educated pt in mindfulness relaxation skills, role played with pt how we can utilize guided imagery as a form of relaxation. Pt was able to close her eyes and visualize being in [place] at her grandmother's house. She was able to share in that moment what she smelled, what she heard, what she seen and identified things that she felt or could touch. Pt was observed with her eyes closed, swaying back and forth sharing details. Pt shared this technique was calming and relaxing. Pt and [facility] collaborated to identify below plan.  ? ?Patient Centered Plan: ?Patient is on the following Treatment Plan(s): Anxiety and Depression.  ? ?Assessment: ?Patient currently experiencing ongoing symptoms of anxiety and depression stemming from psycho social factors and environmental stressor. ? ?Patient may benefit from continued support of this clinic, challenging irrational thoughts, utilizing and implementing coping skills.. ? ?Plan: ?Follow up with behavioral health clinician on : 06/06/2021 at 10:30AM ?Behavioral recommendations: Pt will use guided imagery to reduce anxiety symptoms. Use 5 sense in visualizing happy/safe place in [place]. Pt will take walks when she can, get out the house more and/or visit the zoo when she's off work.  ?Referral(s): Integrated [employer] (In Clinic) ?"From scale of 1-10, how likely are you to follow plan?": Pt agreed to above plan.  ? ?Annalese Stiner Cruzita Lederer, LCSWA ? ? ?

## 2021-05-17 LAB — URINE CYTOLOGY ANCILLARY ONLY
Bacterial Vaginitis-Urine: NEGATIVE
Candida Urine: NEGATIVE
Chlamydia: NEGATIVE
Comment: NEGATIVE
Comment: NEGATIVE
Comment: NORMAL
Neisseria Gonorrhea: NEGATIVE
Trichomonas: NEGATIVE

## 2021-05-17 LAB — T4, FREE: Free T4: 1 ng/dL (ref 0.8–1.8)

## 2021-05-17 LAB — TSH: TSH: 1.73 mIU/L

## 2021-06-06 ENCOUNTER — Ambulatory Visit (INDEPENDENT_AMBULATORY_CARE_PROVIDER_SITE_OTHER): Payer: Medicaid Other | Admitting: Pediatrics

## 2021-06-06 ENCOUNTER — Ambulatory Visit (INDEPENDENT_AMBULATORY_CARE_PROVIDER_SITE_OTHER): Payer: Medicaid Other | Admitting: Licensed Clinical Social Worker

## 2021-06-06 VITALS — BP 105/48 | HR 69 | Ht 65.12 in | Wt 172.6 lb

## 2021-06-06 DIAGNOSIS — F4321 Adjustment disorder with depressed mood: Secondary | ICD-10-CM

## 2021-06-06 DIAGNOSIS — G479 Sleep disorder, unspecified: Secondary | ICD-10-CM | POA: Diagnosis not present

## 2021-06-06 DIAGNOSIS — F4323 Adjustment disorder with mixed anxiety and depressed mood: Secondary | ICD-10-CM | POA: Diagnosis not present

## 2021-06-06 DIAGNOSIS — L658 Other specified nonscarring hair loss: Secondary | ICD-10-CM

## 2021-06-06 DIAGNOSIS — N926 Irregular menstruation, unspecified: Secondary | ICD-10-CM

## 2021-06-06 NOTE — BH Specialist Note (Signed)
Integrated Behavioral Health Follow Up In-Person Visit ? ?MRN: [id number] ?Name: Theresa Hubbard ? ?Number of Integrated Behavioral Health Clinician visits: 3/6 ?Session Start time: 10:29AM  ?Session End time: 11:15AM ?Total time in minutes: 46 MINS ? ?Types of Service: Individual psychotherapy ? ?Interpretor:No. Interpretor Name and Language: None  ? ?Subjective: ?Theresa Hubbard is a 21 y.o. female accompanied by  self  ?Patient was referred by Cephus Slater for anxiety and depressive symptoms. ?Patient reports the following symptoms/concerns: worries, feeling sad and financial concerns.  ?Duration of problem: months; Severity of problem: moderate ? ?Objective: ?Mood: Euthymic and Affect: Appropriate ?Risk of harm to self or others: No plan to harm self or others ? ?Life Context: ?Family and Social: Pt has moved out with her boyfriend and now lives with a female cousin.  ?School/Work: Pt works fulltime at [employer]  ?Self-Care: Spending time with her dogs, working, spending time with cousins and playing videogames.  ?Life Changes: Pt had miscarriage in 2022. Moved with her female cousin In May.  ? ?Patient and/or Family's Strengths/Protective Factors: ?Concrete supports in place (healthy food, safe environments, etc.), Sense of purpose, and Physical Health (exercise, healthy diet, medication compliance, etc.) ? ?Goals Addressed: ?Patient will: ? Reduce symptoms of: anxiety and depression  ? Increase knowledge and/or ability of: coping skills and healthy habits  ? Demonstrate ability to: Increase healthy adjustment to current life circumstances and Increase adequate support systems for patient/family ? ?Progress towards Goals: ?Ongoing ? ?Interventions: ?Interventions utilized:  Supportive Counseling, Psychoeducation and/or Health Education, and Supportive Reflection ?Standardized Assessments completed: PHQ-SADS Was completed with Theresa Hubbard  ? ?  05/16/2021  ? 10:15 AM 02/18/2018  ?  4:00 PM  ?PHQ-SADS  Last 3 Score only  ?PHQ-15 Score 16   ?Total GAD-7 Score 16   ?PHQ Adolescent Score 20 14  ?  ? ?Patient and/or Family Response: Pt reports moving out of her boyfriends home and has now moved with her cousin. Since recent move, pt reports feeling much happier, free, increased mood and energy levels. Pt reports feeling like cousin has been very helpful and supportive. Pt reports new stressor is boyfriend. Pt reports boyfriend has been holding a grudge against her for leaving his home and making a decision to want better for herself and future. [facility] and pt discussed conflict resolution skills and discussed healthy and unhealthy boundaries. [facility] and pt role played scenarios of healthy and unhealthy boundaries and self-care tips.  Pt identified self-care is investing in herself and not apologizing for making the best decision for herself. [facility] and pt collaborated to identify below plan.  ? ?Patient Centered Plan: ?Patient is on the following Treatment Plan(s): Anxiety and Depression  ? ?Assessment: ?Patient currently experiencing decrease symptoms of anxiety and depression, increase in energy and mood. Pt reports some stressors surrounding her relationship with her boyfriend.  ? ?Patient may benefit from continued support of this clinic, challenging irrational thoughts, knowledge and use of positive coping skills. ? ?Plan: ?Follow up with behavioral health clinician on : 06/28/21 at 10:30AM ?Behavioral recommendations: Continue making choices and advocating for herself. Continue practicing self-care tips. -working on short term and long term goals, investing in yourself and taking care of herself. Stop apologizing for deciding to focus on you. Practice positive affirmations daily.  ?Referral(s): Integrated [employer] (In Clinic) ?"From scale of 1-10, how likely are you to follow plan?": Pt agreed to above plan.  ? ?Robertta Halfhill Cruzita Lederer, LCSWA ? ? ?

## 2021-06-06 NOTE — Progress Notes (Signed)
History was provided by the patient. ? ?Theresa Hubbard is a 21 y.o. female who is here for anxiety, depression.  ?[name], MD  ? ?HPI:  Pt reports on April 25 she moved to [place] with her cousin. She feels like life has been better. She feels happier, more free. They are also remodeling which is fun and has her animals.  ? ?Started lexapro back and feels like she has been doing a lot better.  ? ?Has some stress with relationship with boyfriend because he is still upset that she moved to her cousin. ? ?LMP April 14-21, started again 2 days ago.  ? ?Has a quarter sized bald spot on her head where she scratches and messes with hair in that area.  ? ? ?  06/10/2021  ?  8:56 AM 05/16/2021  ? 10:15 AM 02/18/2018  ?  4:00 PM  ?PHQ-SADS Last 3 Score only  ?PHQ-15 Score 11 16   ?Total GAD-7 Score 12 16   ?PHQ Adolescent Score 7 20 14   ?  ? ? ?Patient's last menstrual period was 05/17/2021. ? ? ?Patient Active Problem List  ? Diagnosis Date Noted  ? Constipation 05/16/2021  ? Sleep disturbance 05/16/2021  ? Adjustment disorder with depressed mood 02/29/2020  ? Irregular menstruation 03/15/2018  ? Acne vulgaris 12/21/2013  ? ? ?Current Outpatient Medications on File Prior to Visit  ?Medication Sig Dispense Refill  ? docusate sodium (COLACE) 100 MG capsule Take 1 capsule (100 mg total) by mouth 2 (two) times daily. 60 capsule 3  ? escitalopram (LEXAPRO) 10 MG tablet Take 1 tablet (10 mg total) by mouth daily. 90 tablet 1  ? hydrOXYzine (ATARAX) 10 MG tablet Take 1 tablet (10 mg total) by mouth every 8 (eight) hours as needed for anxiety. 30 tablet 5  ? polyethylene glycol powder (GLYCOLAX/MIRALAX) 17 GM/SCOOP powder Take 17 g by mouth daily. 578 g 6  ? ?No current facility-administered medications on file prior to visit.  ? ? ?No Known Allergies ? ?Physical Exam:  ?  ?Vitals:  ? 06/06/21 0954  ?BP: (!) 105/48  ?Pulse: 69  ?Weight: 172 lb 9.6 oz (78.3 kg)  ?Height: 5' 5.12" (1.654 m)  ? ? ?Growth percentile  SmartLinks can only be used for patients less than 83 years old. ? ?Physical Exam ?Vitals and nursing note reviewed.  ?Constitutional:   ?   General: She is not in acute distress. ?   Appearance: She is well-developed.  ?Neck:  ?   Thyroid: No thyromegaly.  ?Cardiovascular:  ?   Rate and Rhythm: Normal rate and regular rhythm.  ?   Heart sounds: No murmur heard. ?Pulmonary:  ?   Breath sounds: Normal breath sounds.  ?Abdominal:  ?   Palpations: Abdomen is soft. There is no mass.  ?   Tenderness: There is no abdominal tenderness. There is no guarding.  ?Musculoskeletal:  ?   Right lower leg: No edema.  ?   Left lower leg: No edema.  ?Lymphadenopathy:  ?   Cervical: No cervical adenopathy.  ?Skin: ?   General: Skin is warm.  ?   Findings: No rash.  ?   Comments: Small quarter sized area of alopecia with good regrowth of hair  ?Neurological:  ?   Mental Status: She is alert.  ?   Comments: No tremor  ?Psychiatric:     ?   Mood and Affect: Mood and affect normal.  ? ? ?Assessment/Plan: ?1. 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 Reassured her it appears the hair is growing back well in that area. She wanted to continue with rosemary oil for now.  ? ?2. Adjustment disorder with depressed mood and anxiety ?Improvements with lexapro and moving with her cousin. Seeing [facility] again today. Overall pleased with where she is.  ? ?3. Irregular menstruation ?Continue tracking menses and using condoms.  ? ?4. Sleep disturbance ?Sleep is improving.  ? ?Return in 8 weeks  ? ?Alfonso Ramus, FNP ? ? ?

## 2021-06-10 ENCOUNTER — Other Ambulatory Visit: Payer: Self-pay | Admitting: Pediatrics

## 2021-06-10 MED ORDER — KETOCONAZOLE 2 % EX SHAM: 1.0000 "application " | MEDICATED_SHAMPOO | CUTANEOUS | 0 refills | Status: DC

## 2021-06-28 ENCOUNTER — Ambulatory Visit (INDEPENDENT_AMBULATORY_CARE_PROVIDER_SITE_OTHER): Payer: Medicaid Other | Admitting: Licensed Clinical Social Worker

## 2021-06-28 DIAGNOSIS — F4323 Adjustment disorder with mixed anxiety and depressed mood: Secondary | ICD-10-CM | POA: Diagnosis not present

## 2021-06-28 NOTE — BH Specialist Note (Signed)
Integrated Behavioral Health Follow Up In-Person Visit  MRN: [id number] Name: Theresa Hubbard  Number of Integrated Behavioral Health Clinician visits: 4/6 Session Start time: 10:35AM  Session End time: No data recorded Total time in minutes: No data recorded  Types of Service: Individual psychotherapy  Interpretor:No. Interpretor Name and Language: None   Subjective: Theresa Hubbard is a 21 y.o. female accompanied by  Self Patient was referred by Cephus Slater for anxiety and depressive symptoms.. Patient reports the following symptoms/concerns: Worries, feeling sad and financial concerns.  Duration of problem: Months; Severity of problem: moderate  Objective: Mood: Euthymic and Affect: Appropriate Risk of harm to self or others: No plan to harm self or others  Life Context: Family and Social: Pt has moved out with her boyfriend and now lives with a female cousin.  School/Work:  Pt works fulltime at [employer]:  Spending time with her dogs, working, spending time with cousins and playing videogames.  Life Changes: Pt had miscarriage in 2022. Moved with her female cousin In May of 2023.  Patient and/or Family's Strengths/Protective Factors: {CHL AMB BH PROTECTIVE FACTORS:[phone]}  Goals Addressed: Patient will:  Reduce symptoms of: anxiety and depression   Increase knowledge and/or ability of: coping skills and healthy habits   Demonstrate ability to: Increase healthy adjustment to current life circumstances and Increase adequate support systems for patient/family  Progress towards Goals: Ongoing  Interventions: Interventions utilized:  {IBH Interventions:21014054} Standardized Assessments completed: {IBH Screening Tools:21014051}    Patient and/or Family Response: Pt reports breaking up with her boyfriend right after last session.   Taking care of health and gut health, going to the gym, takig care of her hair. Aliumn and probiotic. Being more  consistent, taking care of self, improving health and mental health.   Patient Centered Plan: Patient is on the following Treatment Plan(s): Anxiety and depression  Assessment: Patient currently experiencing ***.   Patient may benefit from continued support of this clinic, challenging irrational thoughts, knowledge and use of positive coping skills.  .  Plan: Follow up with behavioral health clinician on : *** Behavioral recommendations: *** Referral(s): {IBH Referrals:21014055} "From scale of 1-10, how likely are you to follow plan?": Pt agreed to above plan.   Jemiah Ellenburg Cruzita Lederer, LCSWA

## 2021-07-16 ENCOUNTER — Ambulatory Visit (INDEPENDENT_AMBULATORY_CARE_PROVIDER_SITE_OTHER): Payer: Medicaid Other | Admitting: Licensed Clinical Social Worker

## 2021-07-16 DIAGNOSIS — F4323 Adjustment disorder with mixed anxiety and depressed mood: Secondary | ICD-10-CM | POA: Diagnosis not present

## 2021-07-16 NOTE — BH Specialist Note (Signed)
Integrated Behavioral Health Follow Up In-Person Visit  MRN: [id number] Name: Theresa Hubbard  Number of Integrated Behavioral Health Clinician visits: 5/6 Session Start time: 3:53P  Session End time: 4:30P Total time in minutes: 37 MINS  Types of Service: Individual psychotherapy  Interpretor:No. Interpretor Name and Language: None   Subjective: Theresa Hubbard is a 21 y.o. female accompanied by  self Patient was referred by Cephus Slater for anxiety and depressive symptoms. Patient reports the following symptoms/concerns: worries, feeling sad and financial concerns.  Duration of problem: Months; Severity of problem: moderate  Objective: Mood: Depressed and Affect: Tearful Risk of harm to self or others: No plan to harm self or others  Life Context: Family and Social: Pt has moved out with her boyfriend and now lives with a female cousin.  School/Work: Pt works fulltime at [employer]: Spending time with her dogs, working, spending time with cousins and playing videogames.  Life Changes:  Pt had miscarriage in 2022. Moved with her female cousin In May of 2023.  Patient and/or Family's Strengths/Protective Factors: Social and Emotional competence, Concrete supports in place (healthy food, safe environments, etc.), and Sense of purpose  Goals Addressed: Patient will:  Reduce symptoms of: anxiety and depression   Increase knowledge and/or ability of: coping skills and healthy habits   Demonstrate ability to: Increase healthy adjustment to current life circumstances and Increase adequate support systems for patient/family  Progress towards Goals: Ongoing  Interventions: Interventions utilized:  Motivational Interviewing, Solution-Focused Strategies, Psychoeducation and/or Health Education, and Supportive Reflection Standardized Assessments completed: Not Needed  Patient and/or Family Response: Pt reports increase in depressive and anxiety symptoms followed  by recent encounter with ex boyfriend. Pt reports current thoughts, feelings and emotions are centered around ex boyfriend and her desire of wanting to be back in a relationship with him although she understands this would not be helpful or healthy to her. Pt worked to identify coping skills to manage depression and anxiety. Pt reports plan to continue writing in her journal, stay busy and talk to trust supports. [facility] discussed with pt ongoing support with outpatient therapy and pt did not agree to ongoing support at this time. Pt collaborated with [facility] to identify plan below.   Patient Centered Plan: Patient is on the following Treatment Plan(s): Anxiety and Depression  Assessment: Patient currently experiencing increase in depressive and anxiety symptoms. Current social stressors and relationship difficulties.   Patient may benefit from continued support of this clinic, challenging irrational thoughts, knowledge and use of positive coping skills and outpatient therapy for continued support.  Plan: Follow up with behavioral health clinician on : 08/01/21 at 10:30AM Behavioral recommendations: Hortense will take a break from seeing or communicating with ex boyfriend. Continue writing in your journal, stay busy by planning ahead and do things you enjoy and/or find relaxing like watching movies, listening to music, going to the zoo, getting into nature or playing videogames. Please give yourself grace and time by allowing yourself to cope with the change after a break up. --Continue to reach out to trusted friends and family who support you.  Referral(s): Integrated [employer] (In Clinic)  "From scale of 1-10, how likely are you to follow plan?": Pt agreed to above plan.   Angelina Venard Cruzita Lederer, LCSWA

## 2021-08-01 ENCOUNTER — Ambulatory Visit (INDEPENDENT_AMBULATORY_CARE_PROVIDER_SITE_OTHER): Payer: Medicaid Other | Admitting: Family

## 2021-08-01 ENCOUNTER — Ambulatory Visit (INDEPENDENT_AMBULATORY_CARE_PROVIDER_SITE_OTHER): Payer: Medicaid Other | Admitting: Licensed Clinical Social Worker

## 2021-08-01 ENCOUNTER — Encounter: Payer: Self-pay | Admitting: Family

## 2021-08-01 VITALS — BP 100/62 | HR 70 | Ht 65.0 in | Wt 174.6 lb

## 2021-08-01 DIAGNOSIS — N926 Irregular menstruation, unspecified: Secondary | ICD-10-CM | POA: Diagnosis not present

## 2021-08-01 DIAGNOSIS — L658 Other specified nonscarring hair loss: Secondary | ICD-10-CM | POA: Diagnosis not present

## 2021-08-01 DIAGNOSIS — F4323 Adjustment disorder with mixed anxiety and depressed mood: Secondary | ICD-10-CM

## 2021-08-01 NOTE — BH Specialist Note (Signed)
Integrated Behavioral Health Follow Up In-Person Visit  MRN: [id number] Name: Theresa Hubbard  Number of Integrated Behavioral Health Clinician visits: 6 Visit  Session Start time: 10:38AM  Session End time: 11:26AM Total time in minutes: 48 MINS   Types of Service: Individual psychotherapy  Interpretor:No. Interpretor Name and Language: None   Subjective: Theresa Hubbard is a 21 y.o. female accompanied by  Self Patient was referred by Theresa Hubbard for anxiety and depression symptoms. Patient reports the following symptoms/concerns: worries, feeling sad and financial concerns.  Duration of problem: Months; Severity of problem: moderate  Objective: Mood: Euthymic and Affect: Appropriate Risk of harm to self or others: No plan to harm self or others  Life Context: Family and Social:  Pt has moved out with her boyfriend and now lives with a female cousin.  School/Work:  Pt works fulltime at [employer]: Spending time with her dogs, working, spending time with cousins and playing videogames.  Life Changes: Pt had miscarriage in 2022. Moved with her female cousin In May of 2023.  Patient and/or Family's Strengths/Protective Factors: Social and Emotional competence, Concrete supports in place (healthy food, safe environments, etc.), and Physical Health (exercise, healthy diet, medication compliance, etc.)  Goals Addressed: Patient will:  Reduce symptoms of: anxiety and depression   Increase knowledge and/or ability of: coping skills and healthy habits   Demonstrate ability to: Increase healthy adjustment to current life circumstances and Increase adequate support systems for patient/family  Progress towards Goals: Ongoing  Interventions: Interventions utilized:  Supportive Counseling and Supportive Reflection Standardized Assessments completed: PHQ-SADS completed with Theresa Hubbard     08/01/2021   10:02 AM 06/10/2021    8:56 AM 05/16/2021   10:15 AM   PHQ-SADS Last 3 Score only  PHQ-15 Score 4 11 16   Total GAD-7 Score 4 12 16   PHQ Adolescent Score 4 7 20    Patient and/or Family Response: Pt worked to report significant imporvements with mood and anxiety. Pt reports she is hanging out more outside of the home and writing in her journal more. Pt reports she has been caring more for herself by going to the gym more, listening to music and reaching out to trust supports when needed.   Pt reports her hair is growing back and she's in a better place financially. Pt reports she is realizing that some challenges and situations are only temporary and not permanent and this has helped her feel better about certain stressors. Pt reports having positive discussions and interactions with ex-boyfriend. Pt did agree to continue support with [facility].   Patient Centered Plan: Patient is on the following Treatment Plan(s): Anxiety and depression   Assessment: Patient currently experiencing significant improvements in depression and anxiety symptoms. Pt continues to utilize positive coping strategies and trusted supports.   Patient may benefit from continuing to challenging irrational thoughts, knowledge and use of positive coping skills and outpatient therapy for continued support..  Plan: Follow up with behavioral health clinician on : 08/15/21 at 11:30AM Behavioral recommendations: Theresa Hubbard will continue to utilize positive coping outlets; journaling, listening to music, going for walks and reaching out to trusted supports when needed.  Referral(s): Integrated (In Clinic) and Mental Health Services (LME/Outside Clinic) Outpatient therapy.  "From scale of 1-10, how likely are you to follow plan?": Pt agreed to above plan.   Theresa Hubbard 08/17/21, LCSWA

## 2021-08-01 NOTE — Progress Notes (Signed)
History was provided by the patient.  Theresa Hubbard is a 21 y.o. female who is here for adjustment disorder with depressed mood and anxiety.   PCP confirmed? Yes.    [name], MD   Plan at last visit 06/06/21:  Assessment/Plan: 1. 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 Reassured her it appears the hair is growing back well in that area. She wanted to continue with rosemary oil for now.    2. Adjustment disorder with depressed mood and anxiety Improvements with lexapro and moving with her cousin. Seeing [facility] again today. Overall pleased with where she is.    3. Irregular menstruation Continue tracking menses and using condoms.    4. Sleep disturbance Sleep is improving.    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 -move: is good where she is leaving  -LMP: first week of June -using 2% hair growth formula (twice daily) and rosemary and castor oil (once or twice weekly)- spot is improving  -stopped taking Lexapro about 2 weeks after last visit; started probiotic and albumin supplement  -going well, no concerns; no interest in restarting medication  Patient Active Problem List   Diagnosis Date Noted   Constipation 05/16/2021   Sleep disturbance 05/16/2021   Adjustment disorder with depressed mood 02/29/2020   Irregular menstruation 03/15/2018   Acne vulgaris 12/21/2013    Current Outpatient Medications on File Prior to Visit  Medication Sig Dispense Refill   ketoconazole (NIZORAL) 2 % shampoo Apply 1 application. topically 2 (two) times a week. 120 mL 0   docusate sodium (COLACE) 100 MG capsule Take 1 capsule (100 mg total) by mouth 2 (two) times daily. 60 capsule 3   escitalopram (LEXAPRO) 10 MG tablet Take 1 tablet (10 mg total) by mouth daily. 90 tablet 1   hydrOXYzine (ATARAX) 10 MG tablet Take 1  tablet (10 mg total) by mouth every 8 (eight) hours as needed for anxiety. 30 tablet 5   polyethylene glycol powder (GLYCOLAX/MIRALAX) 17 GM/SCOOP powder Take 17 g by mouth daily. 578 g 6   No current facility-administered medications on file prior to visit.    No Known Allergies  Physical Exam:    Vitals:   08/01/21 0935  BP: 100/62  Pulse: 70  Weight: 174 lb 9.6 oz (79.2 kg)  Height: 5\' 5"  (1.651 m)    Growth %ile SmartLinks can only be used for patients less than 22 years old.     08/01/2021   10:02 AM 06/10/2021    8:56 AM 05/16/2021   10:15 AM  PHQ-SADS Last 3 Score only  PHQ-15 Score 4 11 16   Total GAD-7 Score 4 12 16   PHQ Adolescent Score 4 7 20      Physical Exam Constitutional:      General: She is not in acute distress.    Appearance: She is well-developed.  HENT:     Head: Normocephalic and atraumatic.  Eyes:     General: No scleral icterus.    Pupils: Pupils are equal, round, and reactive to light.  Neck:     Thyroid: No thyromegaly.  Cardiovascular:     Rate and Rhythm: Normal rate and regular rhythm.     Heart sounds: Normal heart sounds. No murmur heard. Pulmonary:     Effort: Pulmonary effort is normal.  Breath sounds: Normal breath sounds.  Abdominal:     Palpations: Abdomen is soft.  Musculoskeletal:        General: Normal range of motion.     Cervical back: Normal range of motion and neck supple.  Lymphadenopathy:     Cervical: No cervical adenopathy.  Skin:    General: Skin is warm and dry.     Findings: No rash.     Comments: Hair growth improving in scalp area - showed pictures of progress from start to today on phone   Neurological:     Mental Status: She is alert and oriented to person, place, and time.     Cranial Nerves: No cranial nerve deficit.  Psychiatric:        Behavior: Behavior normal.        Thought Content: Thought content normal.        Judgment: Judgment normal.      Assessment/Plan: 1. Adjustment disorder with  mixed anxiety and depressed mood -significant improvement in PHQSADS scoring since April  -advised to return as needed   2. 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 Irregular menstruation -bleeding monthly; reviewed return precautions

## 2021-08-15 ENCOUNTER — Ambulatory Visit (INDEPENDENT_AMBULATORY_CARE_PROVIDER_SITE_OTHER): Payer: Medicaid Other | Admitting: Licensed Clinical Social Worker

## 2021-08-15 DIAGNOSIS — F4323 Adjustment disorder with mixed anxiety and depressed mood: Secondary | ICD-10-CM

## 2021-08-15 NOTE — BH Specialist Note (Addendum)
Integrated Behavioral Health Follow Up In-Person Visit  MRN: [id number] Name: Theresa Hubbard  Number of Integrated Behavioral Health Clinician visits: Additional Visit  Session Start time: 1138   Session End time: 1234   Total time in minutes: 56   Types of Service: Individual psychotherapy  Interpretor:No. Interpretor Name and Language: None   Subjective: Theresa Hubbard is a 21 y.o. female accompanied by  Self  Patient was referred by Leola Brazil for anxiety and depression symptoms. Patient reports the following symptoms/concerns: worries, feeling sad and financial concerns.  Duration of problem: Months; Severity of problem: moderate  Objective: Mood: Euthymic and Affect: Appropriate Risk of harm to self or others: No plan to harm self or others  Life Context: Family and Social:  Pt has moved out with her boyfriend and now lives with a female cousin.  School/Work: Pt works fulltime at [employer]: Spending time with her dogs, working, spending time with cousins and playing videogames.  Life Changes:  Pt had miscarriage in 2022. Moved with her female cousin In May of 2023.  Patient and/or Family's Strengths/Protective Factors: Social and Emotional competence, Concrete supports in place (healthy food, safe environments, etc.), and Physical Health (exercise, healthy diet, medication compliance, etc.)  Goals Addressed: Patient will:  Reduce symptoms of: anxiety and depression   Increase knowledge and/or ability of: coping skills and healthy habits   Demonstrate ability to: Increase healthy adjustment to current life circumstances and Increase adequate support systems for patient/family  Progress towards Goals: Ongoing  Interventions: Interventions utilized:  Solution-Focused Strategies, Supportive Counseling, Psychoeducation and/or Health Education, and Supportive Reflection Standardized Assessments completed: Not Needed  Patient and/or Family  Response: Patient worked to process improvements and consistency with exercising and going to the gym. Patient reports boost of self-esteem, confidence and more energy.  Patient shares reduced symptoms of anxiety and depressive symptoms and continued use of coping skills. Patient worked to share current relationship with ex boyfriend, discussed thoughts and feelings around conflict and explored problem solving techniques.   Patient Centered Plan: Patient is on the following Treatment Plan(s): Anxiety and depression   Assessment: Patient currently experiencing experiencing significant improvements in depression and anxiety symptoms. Pt continues to utilize positive coping strategies and trusted supports.   Patient may benefit from continuing to challenging irrational thoughts, knowledge and use of positive coping skills and outpatient therapy for continued support.  Plan: Follow up with behavioral health clinician on : 09/09/21 Behavioral recommendations: Darnelle will continue to utilize positive coping outlets; journaling, listening to music, going for walks and reaching out to trusted supports when needed. Labella will continue to put herself first, explore different activities that will bring you peace of mind.  Referral(s): Integrated [facility] (In Clinic) "From scale of 1-10, how likely are you to follow plan?": Patient agreed to above plan.   Niya Behler Cruzita Lederer, LCSWA

## 2021-09-09 ENCOUNTER — Ambulatory Visit (INDEPENDENT_AMBULATORY_CARE_PROVIDER_SITE_OTHER): Payer: Medicaid Other | Admitting: Licensed Clinical Social Worker

## 2021-09-09 DIAGNOSIS — F4323 Adjustment disorder with mixed anxiety and depressed mood: Secondary | ICD-10-CM

## 2021-09-09 NOTE — BH Specialist Note (Addendum)
Integrated Behavioral Health Follow Up In-Person Visit  MRN: [id number] Name: Theresa Hubbard  Number of Integrated Behavioral Health Clinician visits: Additional Visit  Session Start time: 1140  Session End time: 1242  Total time in minutes: 62   Types of Service: Individual psychotherapy  Interpretor:No. Interpretor Name and Language: None   Subjective: Theresa Hubbard is a 21 y.o. female accompanied by  Self  Patient was referred by Leola Brazil for Anxiety and Depression Symptoms. Patient reports the following symptoms/concerns: Worries, feeling sad and financial concerns.  Duration of problem: Months; Severity of problem: moderate  Objective: Mood: Anxious and Affect: Appropriate Risk of harm to self or others: No plan to harm self or others  Life Context: Family and Social: :  Pt has moved out with her boyfriend and now lives with a female cousin.  School/Work:  Pt works fulltime at [employer]: Spending time with her dogs, working, spending time with cousins and playing videogames.  Life Changes:  Pt had miscarriage in 2022. Moved with her female cousin In May of 2023.  Patient and/or Family's Strengths/Protective Factors: Social and Emotional competence, Concrete supports in place (healthy food, safe environments, etc.), and Physical Health (exercise, healthy diet, medication compliance, etc.)  Goals Addressed: Patient will:  Reduce symptoms of: anxiety and depression   Increase knowledge and/or ability of: coping skills and healthy habits   Demonstrate ability to: Increase healthy adjustment to current life circumstances and Increase adequate support systems for patient/family  Progress towards Goals: Ongoing  Interventions: Interventions utilized:  Solution-Focused Strategies, Supportive Counseling, Psychoeducation and/or Health Education, and Supportive Reflection Standardized Assessments completed: PHQ-SADS      09/09/2021    5:01 PM  08/01/2021   10:02 AM 06/10/2021    8:56 AM  PHQ-SADS Last 3 Score only  PHQ-15 Score 4 4 11   Total GAD-7 Score 6 4 12   PHQ Adolescent Score 3 4 7     Patient and/or Family Response: Patient engaged in discussion and reports increase in anxiety due to ongoing relationship difficulties and social stressors. Patient openly shared difficulties with letting others go and moving on with her life. Patient was receptive towards symptoms of grief in losing something or someone that once was significant or special to you. Patient understood ways to experience practical acceptance in accepting where she is right now and allowing herself to feel feelings and emotions. Patient engaged in discussion of writing a goodbye letter to give a free, safe space to explore thoughts and feelings and uncover what might be behind them. Patient collaborated with [facility] to identify plan below.   Patient Centered Plan: Patient is on the following Treatment Plan(s): Anxiety and depression   Assessment: Patient currently experiencing some anxiety symptoms, adjustments and social stressors.   Patient may benefit from continuing to challenging irrational thoughts, knowledge and use of positive coping skills and outpatient therapy for continued support.  Plan: Follow up with behavioral health clinician on : 10/03/21 1:30p Behavioral recommendations: Theresa Hubbard will continue with journaling and write in her journal a goodbye letter to [place]. Theresa Hubbard will practice radical acceptance-acknowledging that the breakup has ended and accepting this in your mind, body and spirit.  Referral(s): Integrated [facility] (In Clinic) "From scale of 1-10, how likely are you to follow plan?": Patient agreed to above plan.   Theresa Hubbard 10/05/21, LCSWA

## 2021-10-03 ENCOUNTER — Ambulatory Visit (INDEPENDENT_AMBULATORY_CARE_PROVIDER_SITE_OTHER): Payer: Medicaid Other | Admitting: Licensed Clinical Social Worker

## 2021-10-03 DIAGNOSIS — F4323 Adjustment disorder with mixed anxiety and depressed mood: Secondary | ICD-10-CM | POA: Diagnosis not present

## 2021-10-03 NOTE — BH Specialist Note (Signed)
Integrated Behavioral Health Follow Up In-Person Visit  MRN: [id number] Name: Theresa Hubbard  Number of Integrated Behavioral Health Clinician visits: Additional Visit  Session Start time: 1335   Session End time: 1400  Total time in minutes: 25   Types of Service: Individual psychotherapy  Interpretor:No. Interpretor Name and Language: None   Subjective: Theresa Hubbard is a 21 y.o. female accompanied by  Self  Patient was referred by Leola Brazil  for Anxiety and Depression Symptoms. Patient reports the following symptoms/concerns: Increased mood, improvements in anxiety symptoms.  Duration of problem: Months; Severity of problem: moderate  Objective: Mood: Euthymic and Affect: Appropriate Risk of harm to self or others: No plan to harm self or others  Life Context: Family and Social: Pt has moved out with her boyfriend and now lives with a female cousin.  School/Work: Pt works fulltime at [employer]: Spending time with her dogs, working, spending time with cousins and playing videogames.  Life Changes:  Pt had miscarriage in 2022. Moved with her female cousin In May of 2023.  Patient and/or Family's Strengths/Protective Factors: Social and Emotional competence, Concrete supports in place (healthy food, safe environments, etc.), and Physical Health (exercise, healthy diet, medication compliance, etc.)  Goals Addressed: Patient will:  Reduce symptoms of: anxiety and depression   Increase knowledge and/or ability of: coping skills and healthy habits   Demonstrate ability to: Increase healthy adjustment to current life circumstances  Progress towards Goals: Achieved  Interventions: Interventions utilized:  Supportive Counseling and Supportive Reflection Standardized Assessments completed: Not Needed  Patient and/or Family Response: Patient worked to report increased mood and increased anxiety symptoms. Patient reports excitement in getting a more  consistent work schedule where she is working 4 days and off 3 days. Patient appeared to be happy while sharing improved relationship status with her ex-boyfriend. Patient reports decreased stressors within social relationships. Patient explored ways to create a work life balance and processed how she is able to create self-care and continue to do things that she enjoys doing. Patient was able to successfully create a plan that works for her to ensure continued use of coping skills to reduce symptoms. Patient collaborated with [facility] to identify plan below.   Patient Centered Plan: Patient is on the following Treatment Plan(s): Anxiety and depression   Assessment: Patient currently experiencing increased mood and improved anxiety symptoms. Patient reports continued use of coping skills.    Patient may benefit from continuing outpatient therapy services at [facility] for continued support.  Plan: Follow up with behavioral health clinician on :PRN as needed.  Behavioral recommendations: [facility] will reach back out to [facility] to schedule an OPT appointment today. Gypsy will continue working on a work-life balance, continue to take time to care for yourself and create healthy boundaries.  Referral(s): Integrated [facility] [facility] (In Clinic) "From scale of 1-10, how likely are you to follow plan?": Patient agreed to above plan.   Nastassia Bazaldua Cruzita Lederer, LCSWA

## 2022-02-03 NOTE — L&D Delivery Note (Addendum)
Delivery Note Theresa Hubbard is a 22 y.o. G2P0010 at [redacted]w[redacted]d admitted for SROM on 12/12 of clear fluid.   GBS Status: Negative/-- (11/19 1610) Maximum Maternal Temperature: 99.5  Labor course: Initial SVE: 1/90/-2. Augmentation with: Pitocin. She then progressed to complete.  ROM: 26h 52m with clear fluid  Birth: At 1256 a viable female was delivered via spontaneous vaginal delivery (Presentation:OA, ROA ). Nuchal cord present: No.  Shoulders and body delivered in usual fashion. Infant placed directly on mom's abdomen for bonding/skin-to-skin, baby dried and stimulated. Cord clamped x 2 after 1 minute and cut by FOB.  Cord blood collected.  The placenta separated spontaneously and delivered via gentle cord traction.  Pitocin infused rapidly IV per protocol. Continuous trickle noted from uterus, methergine given. Bleeding slowed down temporarily. Continued trickle noted again with brisk bleeding on fundal massage, lower uterine segment sweep was performed by Edd Arbour, CNM. Questionable placenta fragments were visualized. Dr Milas Hock called to bedside for evaluation. More clots were expelled from the uterus. No placenta fragments noted to be retained. of cytotec rectally as a precaution for lower uterine segment bleeding. Fundus firm with massage. Placenta inspected and appears to be intact with a 3 VC.  Sponge and instrument count were correct x2.  Intrapartum complications:  None Anesthesia:  epidural Episiotomy: none Lacerations:  1st degree Suture Repair: 3.0 vicryl EBL (mL): 672   Infant: APGAR (1 MIN): 8  APGAR (5 MINS): 9  APGAR (10 MINS):    Infant weight: pending  Delivery Report: Review the Delivery Report for details.    Mom to postpartum.  Baby to Couplet care / Skin to Skin. Placenta to L&D   Plans to Breastfeed Contraception: undecided Circumcision: N/A  Note sent to [facility]: KV for pp visit.  Cleda Mccreedy [facility] 01/16/2023 1:37 PM  Attestation of  Supervision of Student:  I confirm that I have verified the information documented in the nurse midwife student's note and that I have also personally  supervised  the history, physical exam and all medical decision making activities.  I have verified that all services and findings are accurately documented in this student's note; and I agree with management and plan as outlined in the documentation. I have also made any necessary editorial changes.  I was sterile gloved at bedside for delivery and assisted with 3rd stage and repair.  Bernerd Limbo, CNM [facility] [employer], [facility] 01/16/2023 2:02 PM

## 2022-07-02 ENCOUNTER — Telehealth: Payer: Self-pay | Admitting: *Deleted

## 2022-07-02 NOTE — Telephone Encounter (Signed)
Returned call from 07/01/2022. Left a message to call the office to schedule new patient appointment.

## 2022-07-14 ENCOUNTER — Encounter: Payer: Self-pay | Admitting: *Deleted

## 2022-07-16 ENCOUNTER — Ambulatory Visit (INDEPENDENT_AMBULATORY_CARE_PROVIDER_SITE_OTHER): Payer: Medicaid Other | Admitting: Obstetrics and Gynecology

## 2022-07-16 ENCOUNTER — Other Ambulatory Visit (HOSPITAL_COMMUNITY)
Admission: RE | Admit: 2022-07-16 | Discharge: 2022-07-16 | Disposition: A | Discharge status: Home / Self Care | Payer: Medicaid Other | Source: Ambulatory Visit | Attending: Obstetrics and Gynecology | Admitting: Obstetrics and Gynecology

## 2022-07-16 ENCOUNTER — Encounter: Payer: Self-pay | Admitting: Obstetrics and Gynecology

## 2022-07-16 VITALS — BP 118/71 | HR 73 | Wt 174.0 lb

## 2022-07-16 DIAGNOSIS — Z124 Encounter for screening for malignant neoplasm of cervix: Secondary | ICD-10-CM | POA: Diagnosis not present

## 2022-07-16 DIAGNOSIS — F419 Anxiety disorder, unspecified: Secondary | ICD-10-CM | POA: Diagnosis not present

## 2022-07-16 DIAGNOSIS — Z3482 Encounter for supervision of other normal pregnancy, second trimester: Secondary | ICD-10-CM | POA: Diagnosis not present

## 2022-07-16 DIAGNOSIS — Z1339 Encounter for screening examination for other mental health and behavioral disorders: Secondary | ICD-10-CM | POA: Diagnosis not present

## 2022-07-16 DIAGNOSIS — F32A Depression, unspecified: Secondary | ICD-10-CM

## 2022-07-16 DIAGNOSIS — Z3402 Encounter for supervision of normal first pregnancy, second trimester: Secondary | ICD-10-CM

## 2022-07-16 DIAGNOSIS — Z3A14 14 weeks gestation of pregnancy: Secondary | ICD-10-CM

## 2022-07-16 DIAGNOSIS — Z3490 Encounter for supervision of normal pregnancy, unspecified, unspecified trimester: Secondary | ICD-10-CM | POA: Insufficient documentation

## 2022-07-16 NOTE — Addendum Note (Signed)
Addended by: Kathie Dike on: 07/16/2022 11:00 AM   Modules accepted: Orders

## 2022-07-16 NOTE — Patient Instructions (Signed)
We highly recommend childbirth education to help you plan for labor and begin practicing coping skills (which will be needed with or without pain meds).  [place] Childbirth Education Options: Sign up by visiting [email or web address]  Childbirth ~ Self-Paced eClass (English and Spanish) This online class offers you the freedom to complete a childbirth education series in the comfort of your own home at your own pace.  Childbirth Class (In-Person 4-Week Series  or on Saturdays, Virtual 4-Week Series ~ [place]) This interactive in-person class series will help you and your partner prepare for your birth experience. Topics include: Labor & Birth, Comfort Measures, Breathing Techniques, Massage, Medical Interventions, Pain Management Options, Cesarean Birth, Postpartum Care, and Newborn Care  Comfort Techniques for Labor ~ In-Person Class ([place]) This interactive class is designed for parents-to-be who want to learn & practice hands-on skills to help relieve some of the discomfort of labor and encourage their babies to rotate toward the best position for birth. Moms and their partners will be able to try a variety of labor positions with birth balls and rebozos as well as practice breathing, relaxation, and visualization techniques.  Natural Childbirth Class (In-Person 5-Week Series, In-Person on Saturdays or Virtual 5-Week Series ~ [place]) This class series is designed for expectant parents who want to learn and practice natural methods of coping with the process of labor and childbirth.  Cesarean Birth Self-Paced eClass (English and Spanish) This online course provides comprehensive information you can trust as you prepare for a possible cesarean birth. In this class, you'll learn how to make your birth and recovery comfortable and joyful through instructive video clips, animations, and activities.  Waterbirth ~ Virtual Class Interested in a waterbirth? In addition to a consultation  with your credentialed waterbirth provider, this free, informational online class will help you discover whether waterbirth is the right fit for you. Not all obstetrical practices offer waterbirth, so check with your healthcare provider.  Tour (Self-Paced Video) - [facility] [place] Watch our 4 minute video tour of [facility] located in [place].   [place] Parenting Education Options:  Pregnancy 101 (Virtual) Congratulations on your pregnancy! This class is geared toward moms in their first trimester, but everyone is welcome. We are excited to guide you through all aspects of supporting a healthy pregnancy. You will learn what to expect at routine prenatal care appointments, common postpartum adjustments, basic infant safety, and breastfeeding.  Successful Partnering & Parenting ~ In-Person Workshop ([place]) This workshop inspires and equips partners of all economic levels, ages, and cultures to confidently care for their infants, support the birthing persons, and navigate their own transformations into new partners and parents. Learning activities are geared towards supporting partner, but moms are welcome to attend.  'Baby & Me' Parenting Group (Virtual on Wednesdays at 11am) Enjoy this time discussing newborn & infant parenting topics and family adjustment issues with other new parents in a relaxed environment. Each week brings a new speaker or baby-centered activity. This group offers support and connection to parents as they journey through the adjustments and struggles of that sometimes overwhelming first year after the birth of a child.  Baby Safety, CPR, & Choking Class ~ Virtual This life-saving information is meant to encourage parents as they learn important safety and prevention tips as well as infant CPR and relief of choking.  Breastfeeding Class (In-Person in [place] or Virtual) Families learn what to expect in the  first days and weeks of breastfeeding your   newborn. IF YOU ARE AN EMPLOYEE TAKING THIS CLASS FOR CREDIT, DO NOT register yourself. Please e-mail [email or web address].   Breastfeeding Self-Paced eClass (English & Spanish) Families learn what to expect in the first days and weeks of breastfeeding your newborn.  Caring for Baby ~ In-Person, Virtual or Self-Paced Class This in-person class is for both expectant and adoptive parents who want to learn and practice the most up-to-date newborn care for their babies. Focus is on birth through the first six weeks of life.  CPR & Choking Relief for Infants & Children ~ In-Person Class ([place]) This in-person course is designed for any parent, expectant parent, or adult who cares for infants or children. Participants learn and demonstrate cardiopulmonary resuscitation and choking relief procedures for both infants and children.  Grandparent Love ~ In-Person Class Grandparents will learn the most updated infant care and safety recommendations. They will discover ways to support their own children during the transition into the parenting role and receive tips on communicating with the new parents.   Parenting Support Group Options:  Bereavement Grief Support Group (Pregnancy/Infant Loss) - Virtual This is an ongoing experience that meets once a month and is designed to help you honor the past, assist you in discovering tools to strengthen you today, and aid you in developing hope for the future.  Breastfeeding & Pumping Support Group (In-Person on Thursdays at 12pm or Virtual on Tuesdays at 5pm) Join us in-person each Thursday starting June 1st, 2023 at 12pm! This support group is free for all families looking for breastfeeding and/or pumping support.   Community-Based Childbirth Education Options:  [place] [facility] Classes:  Childbirth education classes can help you get ready for a positive parenting experience. You  can also meet other expectant parents and get free stuff for your baby. Each class runs for five weeks on the same night and costs $45 for the mother-to-be and her support person. Medicaid covers the cost if you are eligible. Call [phone] to register.  [facility] [facility] [employer] offers a variety of programs for the [place] community and is another great way to get connected. Please go to [email or web address] for more information.  Childbirth With A Twist! Be informed of your options, get educated on birth, understand what your body is doing, learn how to cope, and have a lot of fun and laughs all while doing it either from the comfort of your couch OR in our cozy office and classroom space near the  airport. If you are taking a virtual class, then class is taught LIVE, so you can ask questions and receive answers in real-time from an experienced doula and childbirth educator.  This virtual childbirth education class will meet for five instruction times online.  Although we are based in , [place], this virtual class is open to anyone in the world. Please visit: [email or web address] for more information.  Books We Love: The Doula Guide to Childbirth by Ananda Lowe and Rachel Zimmerman The First-Time Parent's Childbirth Handbook by Dr. Stephanie Mitchell, CNM The Birth Partner by Penny Simkin  

## 2022-07-16 NOTE — Progress Notes (Addendum)
INITIAL PRENATAL VISIT  Subjective:   Theresa Hubbard is being seen today for her first obstetrical visit.  This is not a planned pregnancy. This is a desired pregnancy.  She is at [redacted]w[redacted]d gestation by ultrasound Her obstetrical history is significant for  none . Relationship with FOB:  partner together, not living together yet . Patient does intend to breast feed. Pregnancy history fully reviewed.  Patient reports no complaints.  Indications for ASA therapy (per uptodate) One of the following: Previous pregnancy with preeclampsia, especially early onset and with an adverse outcome No Multifetal gestation No Chronic hypertension No Type 1 or 2 diabetes mellitus No Chronic kidney disease No Autoimmune disease (antiphospholipid syndrome, systemic lupus erythematosus) No  Two or more of the following: Nulliparity Yes Obesity (body mass index >30 kg/m2) No Family history of preeclampsia in mother or sister No Age ?35 years No Sociodemographic characteristics (African American race, low socioeconomic level) No Personal risk factors (eg, previous pregnancy with low birth weight or small for gestational age infant, previous adverse pregnancy outcome [eg, stillbirth], interval >10 years between pregnancies) No  Indications for early GDM screening  First-degree relative with diabetes Yes BMI >30kg/m2 No Age > 25 No Previous birth of an infant weighing ?4000 g No Gestational diabetes mellitus in a previous pregnancy No Glycated hemoglobin ?5.7 percent (39 mmol/mol), impaired glucose tolerance, or impaired fasting glucose on previous testing No High-risk race/ethnicity (eg, African American, Latino, Native American, Asian American, Pacific Islander) Yes Previous stillbirth of unknown cause No Maternal birthweight > 9 lbs No History of cardiovascular disease No Hypertension or on therapy for hypertension No High-density lipoprotein cholesterol level <35 mg/dL (4.09 mmol/L) and/or  a triglyceride level >250 mg/dL (8.11 mmol/L) No Polycystic ovary syndrome No Physical inactivity Yes Other clinical condition associated with insulin resistance (eg, severe obesity, acanthosis nigricans) No Current use of glucocorticoids No   Early screening tests: FBS, A1C, Random CBG, glucose challenge  Objective:    Obstetric History OB History  Gravida Para Term Preterm AB Living  2       1    SAB IAB Ectopic Multiple Live Births  1            # Outcome Date GA Lbr Len/2nd Weight Sex Delivery Anes PTL Lv  2 Current           1 SAB             Past Medical History:  Diagnosis Date   Broken arm    left   BV (bacterial vaginosis)    Chlamydia    Depression    UTI (urinary tract infection)     Past Surgical History:  Procedure Laterality Date   TYMPANOSTOMY TUBE PLACEMENT     WISDOM TOOTH EXTRACTION      Current Outpatient Medications on File Prior to Visit  Medication Sig Dispense Refill   Prenatal Vit-Fe Fumarate-FA (MULTIVITAMIN-PRENATAL) 27-0.8 MG TABS tablet Take 1 tablet by mouth daily at 12 noon.     No current facility-administered medications on file prior to visit.    No Known Allergies  Social History:  reports that she has never smoked. She has never used smokeless tobacco. She reports that she does not currently use alcohol. She reports that she does not use drugs.  Family History  Problem Relation Age of Onset   Diabetes Mother    Asthma Sister     The following portions of the patient's history were reviewed and updated as  appropriate: allergies, current medications, past family history, past medical history, past social history, past surgical history and problem list.  Review of Systems Review of Systems  All other systems reviewed and are negative.    Physical Exam:  BP 118/71   Pulse 73   Wt 174 lb (78.9 kg)   BMI 28.96 kg/m  CONSTITUTIONAL: Well-developed, well-nourished female in no acute distress.  HENT:  Normocephalic EYES:  Conjunctivae normal.  NECK: Normal range of motion, supple SKIN: Skin is warm and dry.  MUSCULOSKELETAL: Normal range of motion.  NEUROLOGIC: Alert and oriented to person PSYCHIATRIC: Normal mood and affect. Normal behavior. Normal judgment and thought content. CARDIOVASCULAR: Normal heart rate noted, regular rhythm RESPIRATORY: Normal effort ABDOMEN: Soft PELVIC: Normal appearing external genitalia; normal appearing vaginal mucosa and cervix.  No abnormal discharge noted.  Pap smear obtained.  Normal uterine size, no other palpable masses, no uterine or adnexal tenderness. Chaperone present for exam  Fetal Heart Rate (bpm): 150   Movement: Absent       Assessment:    Pregnancy: G2P0010  1. Supervision of low-risk pregnancy, unspecified trimester BP and FHR normal  Doing well overall Discussed practice, where to go for urgent/ emergent needs Offered childbirth education resources and website - Pregnancy, Initial Screen - [place] MFM OB COMP + 14 WK; Future - Prenatal Vit-Fe Fumarate-FA (MULTIVITAMIN-PRENATAL) 27-0.8 MG TABS tablet; Take 1 tablet by mouth daily at 12 noon. - PANORAMA PRENATAL TEST - [place] Basic Panel - HgB A1c  2. Cervical cancer screening  - Cytology - PAP( [place])  3. [redacted] weeks gestation of pregnancy Anticipatory guidance regarding upcoming appointments Guidance on food/drink to avoid in pregnancy.  Discussed otc options for constipation   5. Anxiety 6. Depression, unspecified depression type Elevated PHQ and GAD, offered counseling referral   - Ambulatory referral to Integrated Behavioral Health  Plan:     Initial labs drawn. Prenatal vitamins. Problem list reviewed and updated. Reviewed in detail the nature of the practice with collaborative care between  Genetic screening discussed: NIPS/First trimester screen/Quad/AFP ordered. Role of ultrasound in pregnancy discussed; Anatomy US: ordered.  Discussed clinic routines, schedule of care and  testing, genetic screening options, involvement of students and residents under the direct supervision of APPs and doctors and presence of female providers. Pt verbalized understanding.  Return in 4 weeks for routine prenatal care    Sue Lush, FNP

## 2022-07-17 LAB — CYTOLOGY - PAP: Diagnosis: NEGATIVE

## 2022-07-17 LAB — HEMOGLOBIN A1C
Est. average glucose Bld gHb Est-mCnc: 105 mg/dL
Hgb A1c MFr Bld: 5.3 % (ref 4.8–5.6)

## 2022-07-19 LAB — PREGNANCY, INITIAL SCREEN
Antibody Screen: NEGATIVE
Basophils Absolute: 0 10*3/uL (ref 0.0–0.2)
Basos: 0 %
Bilirubin, UA: NEGATIVE
Chlamydia trachomatis, NAA: NEGATIVE
EOS (ABSOLUTE): 0.1 10*3/uL (ref 0.0–0.4)
Eos: 1 %
Glucose, UA: NEGATIVE
HCV Ab: NONREACTIVE
HIV Screen 4th Generation wRfx: NONREACTIVE
Hematocrit: 37.1 % (ref 34.0–46.6)
Hemoglobin: 12.7 g/dL (ref 11.1–15.9)
Hepatitis B Surface Ag: NEGATIVE
Immature Grans (Abs): 0 10*3/uL (ref 0.0–0.1)
Immature Granulocytes: 0 %
Ketones, UA: NEGATIVE
Leukocytes,UA: NEGATIVE
Lymphocytes Absolute: 1.7 10*3/uL (ref 0.7–3.1)
Lymphs: 20 %
MCH: 32.3 pg (ref 26.6–33.0)
MCHC: 34.2 g/dL (ref 31.5–35.7)
MCV: 94 fL (ref 79–97)
Monocytes Absolute: 0.4 10*3/uL (ref 0.1–0.9)
Monocytes: 4 %
Neisseria Gonorrhoeae by PCR: NEGATIVE
Neutrophils Absolute: 6.7 10*3/uL (ref 1.4–7.0)
Neutrophils: 75 %
Nitrite, UA: NEGATIVE
Platelets: 219 10*3/uL (ref 150–450)
Protein,UA: NEGATIVE
RBC, UA: NEGATIVE
RBC: 3.93 x10E6/uL (ref 3.77–5.28)
RDW: 12.1 % (ref 11.7–15.4)
RPR Ser Ql: NONREACTIVE
Rh Factor: POSITIVE
Rubella Antibodies, IGG: 2.48 index (ref >0.99)
Specific Gravity, UA: 1.022 (ref 1.005–1.030)
Urobilinogen, Ur: 0.2 mg/dL (ref 0.2–1.0)
WBC: 8.8 10*3/uL (ref 3.4–10.8)
pH, UA: 8 — ABNORMAL HIGH (ref 5.0–7.5)

## 2022-07-19 LAB — MICROSCOPIC EXAMINATION
Casts: NONE SEEN /lpf
WBC, UA: NONE SEEN /hpf (ref 0–5)

## 2022-07-19 LAB — URINE CULTURE, OB REFLEX: Organism ID, Bacteria: NO GROWTH

## 2022-07-19 LAB — HCV INTERPRETATION

## 2022-07-21 ENCOUNTER — Encounter: Payer: Medicaid Other | Admitting: Licensed Clinical Social Worker

## 2022-07-31 ENCOUNTER — Ambulatory Visit (INDEPENDENT_AMBULATORY_CARE_PROVIDER_SITE_OTHER): Payer: Medicaid Other | Admitting: Licensed Clinical Social Worker

## 2022-07-31 DIAGNOSIS — Z3490 Encounter for supervision of normal pregnancy, unspecified, unspecified trimester: Secondary | ICD-10-CM | POA: Diagnosis not present

## 2022-07-31 DIAGNOSIS — F4322 Adjustment disorder with anxiety: Secondary | ICD-10-CM

## 2022-08-01 NOTE — BH Specialist Note (Signed)
Integrated Behavioral Health via Telemedicine Visit  08/01/2022 Theresa Hubbard [id number]  Number of Integrated Behavioral Health Clinician visits: 1 Session Start time: 130pm  Session End time: 205pm Total time in minutes: 35 mins via mychart video   Referring Provider: Theora Gianotti FNP Patient/Family location: Home  [facility] Provider location: [facility]  All persons participating in visit: Pt M Ojeda and LCSW A Broly Hatfield  Types of Service: Individual psychotherapy and Video visit  I connected with Ngan Vernell Morgans and/or Nishika Rudell Cobb n/a via  Telephone or Video Enabled Telemedicine Application  (Video is Caregility application) and verified that I am speaking with the correct person using two identifiers. Discussed confidentiality: Yes   I discussed the limitations of telemedicine and the availability of in person appointments.  Discussed there is a possibility of technology failure and discussed alternative modes of communication if that failure occurs.  I discussed that engaging in this telemedicine visit, they consent to the provision of behavioral healthcare and the services will be billed under their insurance.  Patient and/or legal guardian expressed understanding and consented to Telemedicine visit: Yes   Presenting Concerns: Patient and/or family reports the following symptoms/concerns: anxiety Duration of problem: approx one month; Severity of problem: mild  Patient and/or Family's Strengths/Protective Factors: Concrete supports in place (healthy food, safe environments, etc.)  Goals Addressed: Patient will:  Reduce symptoms of: anxiety   Increase knowledge and/or ability of: coping skills   Demonstrate ability to: Increase healthy adjustment to current life circumstances  Progress towards Goals: Ongoing  Interventions: Interventions utilized:  mindfulness, relaxation technique and supportive counseling  Standardized Assessments completed: PHQ  9  Patient and/or Family Response: Ms. Vernell Morgans reports increase anxiety with current pregnancy and work. Pt reports she was written up at work and is currently worried about losing her job. Pt reports fob and family is supportive. Pt reports her job requires lifting and walking and suggest speaking with ob provider and supervisor regarding safe options. LCSW A Ciaira Natividad and Ms. Vernell Morgans did not discuss or suggest leave of absence during this session.   Assessment: Patient currently experiencing adjustment disorder with anxiety.   Patient may benefit from integrated behavioral health.  Plan: Follow up with behavioral health clinician on : 2-3 weeks  Behavioral recommendations: discuss safe options, prioritize rest, journal writing  Referral(s): Integrated [employer] (In Clinic)  I discussed the assessment and treatment plan with the patient and/or parent/guardian. They were provided an opportunity to ask questions and all were answered. They agreed with the plan and demonstrated an understanding of the instructions.   They were advised to call back or seek an in-person evaluation if the symptoms worsen or if the condition fails to improve as anticipated.  Gwyndolyn Saxon, LCSW

## 2022-08-08 LAB — PANORAMA PRENATAL TEST FULL PANEL:PANORAMA TEST PLUS 5 ADDITIONAL MICRODELETIONS: FETAL FRACTION: 8.5

## 2022-08-08 LAB — HORIZON CUSTOM: REPORT SUMMARY: NEGATIVE

## 2022-08-12 ENCOUNTER — Ambulatory Visit (INDEPENDENT_AMBULATORY_CARE_PROVIDER_SITE_OTHER): Payer: Medicaid Other | Admitting: Student

## 2022-08-12 VITALS — BP 118/70 | HR 73 | Wt 175.0 lb

## 2022-08-12 DIAGNOSIS — Z3A18 18 weeks gestation of pregnancy: Secondary | ICD-10-CM

## 2022-08-12 DIAGNOSIS — F419 Anxiety disorder, unspecified: Secondary | ICD-10-CM

## 2022-08-12 DIAGNOSIS — Z3402 Encounter for supervision of normal first pregnancy, second trimester: Secondary | ICD-10-CM

## 2022-08-12 DIAGNOSIS — F32A Depression, unspecified: Secondary | ICD-10-CM

## 2022-08-12 MED ORDER — MAG-OXIDE 200 MG PO TABS
400.0000 mg | ORAL_TABLET | Freq: Every day | ORAL | 3 refills | Status: DC
Start: 2022-08-12 — End: 2022-11-25

## 2022-08-12 NOTE — Progress Notes (Signed)
   PRENATAL VISIT NOTE  Subjective:  Theresa Hubbard is a 22 y.o. G2P0010 at [redacted]w[redacted]d being seen today for ongoing prenatal care.  She is currently monitored for the following issues for this low-risk pregnancy and has Acne vulgaris; Irregular menstruation; Adjustment disorder with depressed mood; Constipation; Sleep disturbance; and Supervision of normal pregnancy on their problem list.  Patient reports  being anxious, experiencing constipation, and mild backache at night . Patient's  current employer is inducing stress surrounding performance and safe limitations in pregnancy. Patient is currently expected to stay on feet with minimal breaks and move equipment > 100lbs. This is contributing to her anxiety and sad mood as she adjusts to pregnancy. Patient states she has seen therapists in past and recommended pharmacological therapy. Has support from partner and family.  Contractions: Not present. Vag. Bleeding: None.  Movement: Present. Denies leaking of fluid.   The following portions of the patient's history were reviewed and updated as appropriate: allergies, current medications, past family history, past medical history, past social history, past surgical history and problem list.   Objective:   Vitals:   08/12/22 0944  BP: 118/70  Pulse: 73  Weight: 175 lb (79.4 kg)    Fetal Status: Fetal Heart Rate (bpm): 145   Movement: Present     General:  Alert, oriented and cooperative. Patient is in no acute distress.  Skin: Skin is warm and dry. No rash noted.   Cardiovascular: Normal heart rate noted  Respiratory: Normal respiratory effort, no problems with respiration noted  Abdomen: Soft, gravid, appropriate for gestational age.  Pain/Pressure: Absent     Pelvic: Cervical exam deferred        Extremities: Normal range of motion.  Edema: None  Mental Status: Normal mood and affect. Normal behavior. Normal judgment and thought content.   Assessment and Plan:  Pregnancy: G2P0010 at  [redacted]w[redacted]d 1. Encounter for supervision of normal first pregnancy in second trimester - Reports fetal flutters - Discussed option for Magnesium at night as a sleep aid and constipation relief. Discussed other OTC or rx. Options if problem worsens or continues.  - AFP, Serum, Open Spina Bifida  2. [redacted] weeks gestation of pregnancy - continue routine follow-up   3. Anxiety and depression - Emotional support provided to patient. Discussed possible coping methods for anxiety such as grounding and finding pleasurable activities to participate in. Encouraged to reach out to family and office for support as needed. Discussed option for additional psychotherapy and potential pharmacological therapy. Patient prefers in-person follow-up with [facility] and discussion of available long-term resources. Will consider pharmacological support for future use. Partner at bedside and engaged and supportive throughout conversation.    Preterm labor symptoms and general obstetric precautions including but not limited to vaginal bleeding, contractions, leaking of fluid and fetal movement were reviewed in detail with the patient. Please refer to After Visit Summary for other counseling recommendations.   Return in about 4 weeks (around 09/09/2022) for LOB, IN-PERSON.  Future Appointments  Date Time Provider Department Center  08/19/2022  7:30 AM [facility] NURSE [facility]  08/19/2022  7:45 AM [facility] US4 [facility]  09/11/2022  9:50 AM Lennart Pall, MD [facility]    Corlis Hove, NP

## 2022-08-14 LAB — AFP, SERUM, OPEN SPINA BIFIDA
AFP MoM: 1.04
AFP Value: 42.2 ng/mL
Gest. Age on Collection Date: 18.1 weeks
Maternal Age At EDD: 22.9 yr
OSBR Risk 1 IN: 10000
Test Results:: NEGATIVE
Weight: 175 [lb_av]

## 2022-08-19 ENCOUNTER — Ambulatory Visit: Payer: Medicaid Other | Attending: Obstetrics and Gynecology

## 2022-08-19 ENCOUNTER — Ambulatory Visit: Payer: Medicaid Other

## 2022-08-19 ENCOUNTER — Other Ambulatory Visit: Payer: Self-pay

## 2022-08-19 DIAGNOSIS — Z3687 Encounter for antenatal screening for uncertain dates: Secondary | ICD-10-CM | POA: Diagnosis not present

## 2022-08-19 DIAGNOSIS — Z363 Encounter for antenatal screening for malformations: Secondary | ICD-10-CM | POA: Diagnosis not present

## 2022-08-19 DIAGNOSIS — Z3A18 18 weeks gestation of pregnancy: Secondary | ICD-10-CM | POA: Insufficient documentation

## 2022-08-19 DIAGNOSIS — Z3490 Encounter for supervision of normal pregnancy, unspecified, unspecified trimester: Secondary | ICD-10-CM

## 2022-08-19 DIAGNOSIS — Z362 Encounter for other antenatal screening follow-up: Secondary | ICD-10-CM

## 2022-09-11 ENCOUNTER — Ambulatory Visit (INDEPENDENT_AMBULATORY_CARE_PROVIDER_SITE_OTHER): Payer: Medicaid Other | Admitting: Obstetrics and Gynecology

## 2022-09-11 VITALS — BP 112/67 | HR 82 | Wt 182.0 lb

## 2022-09-11 DIAGNOSIS — Z3A21 21 weeks gestation of pregnancy: Secondary | ICD-10-CM

## 2022-09-11 DIAGNOSIS — Z3402 Encounter for supervision of normal first pregnancy, second trimester: Secondary | ICD-10-CM

## 2022-09-11 NOTE — Progress Notes (Signed)
   PRENATAL VISIT NOTE  Subjective:  Theresa Hubbard is a 22 y.o. G2P0010 at [redacted]w[redacted]d being seen today for ongoing prenatal care.  She is currently monitored for the following issues for this low-risk pregnancy and has Adjustment disorder with depressed mood and Supervision of normal pregnancy on their problem list.  Patient reports  early satiety & vomiting after meals - feels full but baby is still hungry. Meals include 10pc nuggets. Also having bad back pain. Has not tried medication, wondering if chiropractor is a good idea .  Contractions: Not present. Vag. Bleeding: None.  Movement: Present. Denies leaking of fluid.   The following portions of the patient's history were reviewed and updated as appropriate: allergies, current medications, past family history, past medical history, past social history, past surgical history and problem list.   Objective:   Vitals:   09/11/22 0937  BP: 112/67  Pulse: 82  Weight: 182 lb (82.6 kg)    Fetal Status: Fetal Heart Rate (bpm): 145   Movement: Present     General:  Alert, oriented and cooperative. Patient is in no acute distress.  Skin: Skin is warm and dry. No rash noted.   Cardiovascular: Normal heart rate noted  Respiratory: Normal respiratory effort, no problems with respiration noted  Abdomen: Soft, gravid, appropriate for gestational age.  Pain/Pressure: Absent      Assessment and Plan:  Pregnancy: G2P0010 at [redacted]w[redacted]d 1. Encounter for supervision of normal first pregnancy in second trimester 2. [redacted] weeks gestation of pregnancy Anatomy incomplete, repeat scheduled Discussed options for management of back pain & indigestion  Please refer to After Visit Summary for other counseling recommendations.   Future Appointments  Date Time Provider Department Center  09/19/2022  3:30 PM [facility] US3 [facility]  10/13/2022  9:50 AM Penne Lash, Fredrich Romans, MD [facility]    Lennart Pall, MD

## 2022-09-16 ENCOUNTER — Ambulatory Visit: Payer: Medicaid Other

## 2022-09-19 ENCOUNTER — Ambulatory Visit: Payer: Medicaid Other | Attending: Obstetrics and Gynecology

## 2022-09-19 DIAGNOSIS — Z3A22 22 weeks gestation of pregnancy: Secondary | ICD-10-CM

## 2022-09-19 DIAGNOSIS — Z362 Encounter for other antenatal screening follow-up: Secondary | ICD-10-CM | POA: Insufficient documentation

## 2022-10-01 ENCOUNTER — Encounter: Payer: Self-pay | Admitting: *Deleted

## 2022-10-13 ENCOUNTER — Ambulatory Visit: Payer: Medicaid Other | Admitting: Obstetrics & Gynecology

## 2022-10-13 VITALS — BP 115/67 | HR 101 | Wt 190.0 lb

## 2022-10-13 DIAGNOSIS — Z3400 Encounter for supervision of normal first pregnancy, unspecified trimester: Secondary | ICD-10-CM

## 2022-10-13 DIAGNOSIS — O99891 Other specified diseases and conditions complicating pregnancy: Secondary | ICD-10-CM

## 2022-10-13 DIAGNOSIS — Z3A26 26 weeks gestation of pregnancy: Secondary | ICD-10-CM

## 2022-10-13 DIAGNOSIS — S76811A Strain of other specified muscles, fascia and tendons at thigh level, right thigh, initial encounter: Secondary | ICD-10-CM

## 2022-10-13 DIAGNOSIS — M545 Low back pain, unspecified: Secondary | ICD-10-CM

## 2022-10-13 DIAGNOSIS — R0981 Nasal congestion: Secondary | ICD-10-CM

## 2022-10-13 DIAGNOSIS — M549 Dorsalgia, unspecified: Secondary | ICD-10-CM

## 2022-10-13 MED ORDER — FEXOFENADINE HCL 180 MG PO TABS: 180.0000 mg | ORAL_TABLET | Freq: Every day | ORAL | 3 refills | Status: DC

## 2022-10-13 NOTE — Progress Notes (Signed)
   PRENATAL VISIT NOTE  Subjective:  Theresa Hubbard is a 22 y.o. G2P0010 at [redacted]w[redacted]d being seen today for ongoing prenatal care.  She is currently monitored for the following issues for this low-risk pregnancy and has Adjustment disorder with depressed mood and Supervision of normal pregnancy on their problem list.  Patient reports backache.  Contractions: Not present. Vag. Bleeding: None.  Movement: Present. Denies leaking of fluid.   The following portions of the patient's history were reviewed and updated as appropriate: allergies, current medications, past family history, past medical history, past social history, past surgical history and problem list.   Objective:   Vitals:   10/13/22 0937  BP: 115/67  Pulse: (!) 101  Weight: 190 lb (86.2 kg)    Fetal Status: Fetal Heart Rate (bpm): 151   Movement: Present     General:  Alert, oriented and cooperative. Patient is in no acute distress.  Skin: Skin is warm and dry. No rash noted.   Cardiovascular: Normal heart rate noted  Respiratory: Normal respiratory effort, no problems with respiration noted  Abdomen: Soft, gravid, appropriate for gestational age.  Pain/Pressure: Present     Pelvic: Cervical exam deferred        Extremities: Normal range of motion.  Edema: None  Mental Status: Normal mood and affect. Normal behavior. Normal judgment and thought content.   Assessment and Plan:  Pregnancy: G2P0010 at [redacted]w[redacted]d   Back pain and  iliopsoas pain on left--rolling and stretching helps.  Refer to PT for more recommendations.   Anatomy is normal  GTT and 28 week labs in the next 2 weeks Allegra for daily morning stuffy nose; if doesn't work can try nasal steroids.  Eats one large meal a day and small snacks--weight gain is about 30 pounds thus far.          Preterm labor symptoms and general obstetric precautions including but not limited to vaginal bleeding, contractions, leaking of fluid and fetal movement were reviewed in  detail with the patient. Please refer to After Visit Summary for other counseling recommendations.   RTC 3-4 weeks   Elsie Lincoln, MD

## 2022-10-14 DIAGNOSIS — Z3400 Encounter for supervision of normal first pregnancy, unspecified trimester: Secondary | ICD-10-CM | POA: Diagnosis not present

## 2022-10-15 ENCOUNTER — Encounter: Payer: Self-pay | Admitting: Obstetrics & Gynecology

## 2022-10-15 ENCOUNTER — Encounter (HOSPITAL_BASED_OUTPATIENT_CLINIC_OR_DEPARTMENT_OTHER): Payer: Self-pay

## 2022-10-15 ENCOUNTER — Emergency Department (HOSPITAL_BASED_OUTPATIENT_CLINIC_OR_DEPARTMENT_OTHER)
Admission: EM | Admit: 2022-10-15 | Discharge: 2022-10-15 | Disposition: A | Discharge status: Home / Self Care | Payer: Medicaid Other | Attending: Emergency Medicine | Admitting: Emergency Medicine

## 2022-10-15 DIAGNOSIS — Z3A26 26 weeks gestation of pregnancy: Secondary | ICD-10-CM | POA: Diagnosis not present

## 2022-10-15 DIAGNOSIS — J029 Acute pharyngitis, unspecified: Secondary | ICD-10-CM | POA: Diagnosis not present

## 2022-10-15 DIAGNOSIS — O219 Vomiting of pregnancy, unspecified: Secondary | ICD-10-CM | POA: Diagnosis present

## 2022-10-15 DIAGNOSIS — O211 Hyperemesis gravidarum with metabolic disturbance: Secondary | ICD-10-CM | POA: Diagnosis not present

## 2022-10-15 DIAGNOSIS — O99512 Diseases of the respiratory system complicating pregnancy, second trimester: Secondary | ICD-10-CM | POA: Diagnosis not present

## 2022-10-15 DIAGNOSIS — E860 Dehydration: Secondary | ICD-10-CM | POA: Insufficient documentation

## 2022-10-15 DIAGNOSIS — Z20822 Contact with and (suspected) exposure to covid-19: Secondary | ICD-10-CM | POA: Diagnosis not present

## 2022-10-15 DIAGNOSIS — R112 Nausea with vomiting, unspecified: Secondary | ICD-10-CM

## 2022-10-15 DIAGNOSIS — D72829 Elevated white blood cell count, unspecified: Secondary | ICD-10-CM | POA: Insufficient documentation

## 2022-10-15 DIAGNOSIS — O99282 Endocrine, nutritional and metabolic diseases complicating pregnancy, second trimester: Secondary | ICD-10-CM | POA: Insufficient documentation

## 2022-10-15 LAB — CBC
Hematocrit: 36.3 % (ref 34.0–46.6)
Hemoglobin: 12 g/dL (ref 11.1–15.9)
MCH: 32 pg (ref 26.6–33.0)
MCHC: 33.1 g/dL (ref 31.5–35.7)
MCV: 97 fL (ref 79–97)
Platelets: 234 10*3/uL (ref 150–450)
RBC: 3.75 x10E6/uL — ABNORMAL LOW (ref 3.77–5.28)
RDW: 11.6 % — ABNORMAL LOW (ref 11.7–15.4)
WBC: 9.2 10*3/uL (ref 3.4–10.8)

## 2022-10-15 LAB — RPR: RPR Ser Ql: NONREACTIVE

## 2022-10-15 LAB — CBC WITH DIFFERENTIAL/PLATELET
Abs Immature Granulocytes: 0.11 10*3/uL — ABNORMAL HIGH (ref 0.00–0.07)
Basophils Absolute: 0 10*3/uL (ref 0.0–0.1)
Basophils Relative: 0 %
Eosinophils Absolute: 0.1 10*3/uL (ref 0.0–0.5)
Eosinophils Relative: 1 %
HCT: 31.5 % — ABNORMAL LOW (ref 36.0–46.0)
Hemoglobin: 11.1 g/dL — ABNORMAL LOW (ref 12.0–15.0)
Immature Granulocytes: 1 %
Lymphocytes Relative: 15 %
Lymphs Abs: 1.7 10*3/uL (ref 0.7–4.0)
MCH: 32.9 pg (ref 26.0–34.0)
MCHC: 35.2 g/dL (ref 30.0–36.0)
MCV: 93.5 fL (ref 80.0–100.0)
Monocytes Absolute: 0.9 10*3/uL (ref 0.1–1.0)
Monocytes Relative: 7 %
Neutro Abs: 9 10*3/uL — ABNORMAL HIGH (ref 1.7–7.7)
Neutrophils Relative %: 76 %
Platelets: 211 10*3/uL (ref 150–400)
RBC: 3.37 MIL/uL — ABNORMAL LOW (ref 3.87–5.11)
RDW: 12.2 % (ref 11.5–15.5)
WBC: 11.8 10*3/uL — ABNORMAL HIGH (ref 4.0–10.5)
nRBC: 0 % (ref 0.0–0.2)

## 2022-10-15 LAB — COMPREHENSIVE METABOLIC PANEL
ALT: 9 U/L (ref 0–44)
AST: 13 U/L — ABNORMAL LOW (ref 15–41)
Albumin: 3.1 g/dL — ABNORMAL LOW (ref 3.5–5.0)
Alkaline Phosphatase: 63 U/L (ref 38–126)
Anion gap: 6 (ref 5–15)
BUN: 6 mg/dL (ref 6–20)
CO2: 23 mmol/L (ref 22–32)
Calcium: 7.7 mg/dL — ABNORMAL LOW (ref 8.9–10.3)
Chloride: 107 mmol/L (ref 98–111)
Creatinine, Ser: 0.57 mg/dL (ref 0.44–1.00)
GFR, Estimated: >60 mL/min (ref >60)
Glucose, Bld: 98 mg/dL (ref 70–99)
Potassium: 3.9 mmol/L (ref 3.5–5.1)
Sodium: 136 mmol/L (ref 135–145)
Total Bilirubin: 0.3 mg/dL (ref 0.3–1.2)
Total Protein: 5.9 g/dL — ABNORMAL LOW (ref 6.5–8.1)

## 2022-10-15 LAB — GLUCOSE TOLERANCE, 2 HOURS W/ 1HR
Glucose, 1 hour: 159 mg/dL (ref 70–179)
Glucose, 2 hour: 129 mg/dL (ref 70–152)
Glucose, Fasting: 80 mg/dL (ref 70–91)

## 2022-10-15 LAB — URINALYSIS, ROUTINE W REFLEX MICROSCOPIC
Bilirubin Urine: NEGATIVE
Glucose, UA: 250 mg/dL — AB
Hgb urine dipstick: NEGATIVE
Ketones, ur: NEGATIVE mg/dL
Leukocytes,Ua: NEGATIVE
Nitrite: NEGATIVE
Protein, ur: NEGATIVE mg/dL
Specific Gravity, Urine: 1.024 (ref 1.005–1.030)
pH: 6 (ref 5.0–8.0)

## 2022-10-15 LAB — SARS CORONAVIRUS 2 BY RT PCR: SARS Coronavirus 2 by RT PCR: NEGATIVE

## 2022-10-15 LAB — LIPASE, BLOOD: Lipase: 14 U/L (ref 11–51)

## 2022-10-15 LAB — HIV ANTIBODY (ROUTINE TESTING W REFLEX): HIV Screen 4th Generation wRfx: NONREACTIVE

## 2022-10-15 LAB — GROUP A STREP BY PCR: Group A Strep by PCR: NOT DETECTED

## 2022-10-15 MED ORDER — AMOXICILLIN-POT CLAVULANATE 875-125 MG PO TABS: 1.0000 | ORAL_TABLET | Freq: Two times a day (BID) | ORAL | 0 refills | Status: DC

## 2022-10-15 MED ORDER — SODIUM CHLORIDE 0.9 % IV BOLUS
1000.0000 mL | Freq: Once | INTRAVENOUS | Status: AC
  Administered 2022-10-15: 1000 mL via INTRAVENOUS

## 2022-10-15 MED ORDER — ONDANSETRON HCL 4 MG/2ML IJ SOLN
4.0000 mg | Freq: Once | INTRAMUSCULAR | Status: AC
  Administered 2022-10-15: 4 mg via INTRAVENOUS
  Filled 2022-10-15: qty 2

## 2022-10-15 MED ORDER — ONDANSETRON 4 MG PO TBDP: 4.0000 mg | ORAL_TABLET | Freq: Three times a day (TID) | ORAL | 0 refills | Status: DC | PRN

## 2022-10-15 MED ORDER — LIDOCAINE VISCOUS HCL 2 % MT SOLN
15.0000 mL | Freq: Once | OROMUCOSAL | Status: AC
  Administered 2022-10-15: 15 mL via OROMUCOSAL
  Filled 2022-10-15: qty 15

## 2022-10-15 NOTE — ED Provider Notes (Signed)
Pt signed out by Dr. Judd Lien pending labs and symptomatic improvement.  Covid/strep neg  UA neg for infection, cmp nl, cbc with mild anemia, lip nl  Pt is now able to tolerate po fluids.  Her throat is very sore and red, so I am going to start her on augmentin.  Pt is stable for d/c.  Return if worse.    Jacalyn Lefevre, MD 10/15/22 [phone]

## 2022-10-15 NOTE — ED Provider Notes (Signed)
[facility] AT [facility] Provider Note   CSN: [id number] Arrival date & time: 10/15/22  4098     History  Chief Complaint  Patient presents with   Emesis During Pregnancy   Nasal Congestion    Theresa Hubbard is a 22 y.o. female.  Patient is a 22 year old female G2 P0-0-1-0 at [redacted] weeks gestation.  Patient presenting today with complaints of congestion, sore throat for the past few days.  She also reports episodes of vomiting over the past 24 hours.  At 1 point, she reports vomiting up blood.  She denies any bloody stool or melena.  No fevers or chills.  She denies any abdominal pain, vaginal bleeding, or spotting.  The history is provided by the patient.       Home Medications Prior to Admission medications   Medication Sig Start Date End Date Taking? Authorizing Provider  fexofenadine (ALLEGRA ALLERGY) 180 MG tablet Take 1 tablet (180 mg total) by mouth daily. 10/13/22   Lesly Dukes, MD  Magnesium Oxide -Mg Supplement (MAG-OXIDE) 200 MG TABS Take 2 tablets (400 mg total) by mouth at bedtime. If that amount causes loose stools in the am, switch to 200mg  daily at bedtime. 08/12/22   Corlis Hove, NP  Prenatal Vit-Fe Fumarate-FA (MULTIVITAMIN-PRENATAL) 27-0.8 MG TABS tablet Take 1 tablet by mouth daily at 12 noon.    [provider]      Allergies    Patient has no known allergies.    Review of Systems   Review of Systems  All other systems reviewed and are negative.   Physical Exam Updated Vital Signs BP (!) 146/86   Pulse 97   Temp 98.7 F (37.1 C) (Oral)   Resp 20   SpO2 96%  Physical Exam Vitals and nursing note reviewed.  Constitutional:      General: She is not in acute distress.    Appearance: She is well-developed. She is not diaphoretic.  HENT:     Head: Normocephalic and atraumatic.  Cardiovascular:     Rate and Rhythm: Normal rate and regular rhythm.     Heart sounds: No murmur heard.    No  friction rub. No gallop.  Pulmonary:     Effort: Pulmonary effort is normal. No respiratory distress.     Breath sounds: Normal breath sounds. No wheezing.  Abdominal:     General: Bowel sounds are normal. There is no distension.     Palpations: Abdomen is soft.     Tenderness: There is no abdominal tenderness.  Musculoskeletal:        General: Normal range of motion.     Cervical back: Normal range of motion and neck supple.  Skin:    General: Skin is warm and dry.  Neurological:     General: No focal deficit present.     Mental Status: She is alert and oriented to person, place, and time.     ED Results / Procedures / Treatments   Labs (all labs ordered are listed, but only abnormal results are displayed) Labs Reviewed  SARS CORONAVIRUS 2 BY RT PCR  COMPREHENSIVE METABOLIC PANEL  CBC WITH DIFFERENTIAL/PLATELET  LIPASE, BLOOD    EKG None  Radiology No results found.  Procedures Procedures  {Document cardiac monitor, telemetry assessment procedure when appropriate:1}  Medications Ordered in ED Medications  sodium chloride 0.9 % bolus 1,000 mL (has no administration in time range)  ondansetron (ZOFRAN) injection 4 mg (has no administration in time range)  ED Course/ Medical Decision Making/ A&P   {   Click here for ABCD2, HEART and other calculatorsREFRESH Note before signing :1}                              Medical Decision Making Amount and/or Complexity of Data Reviewed Labs: ordered.  Risk Prescription drug management.   ***  {Document critical care time when appropriate:1} {Document review of labs and clinical decision tools ie heart score, Chads2Vasc2 etc:1}  {Document your independent review of radiology images, and any outside records:1} {Document your discussion with family members, caretakers, and with consultants:1} {Document social determinants of health affecting pt's care:1} {Document your decision making why or why not admission,  treatments were needed:1} Final Clinical Impression(s) / ED Diagnoses Final diagnoses:  None    Rx / DC Orders ED Discharge Orders     None

## 2022-10-15 NOTE — ED Notes (Signed)
Dc instructions reviewed with patient. Patient voiced understanding. Dc with belongings.  °

## 2022-10-15 NOTE — ED Triage Notes (Signed)
Pt states that she has been having congestion, sore throat and vomiting since Sun, pt feels like her throat is swollen and makes is hard to breath. Pt is also [redacted] weeks pregnant, G1P0

## 2022-10-20 ENCOUNTER — Encounter (INDEPENDENT_AMBULATORY_CARE_PROVIDER_SITE_OTHER): Payer: Self-pay

## 2022-10-28 NOTE — Therapy (Signed)
OUTPATIENT PHYSICAL THERAPY THORACOLUMBAR EVALUATION   Patient Name: Theresa Hubbard MRN: [id number] DOB:2000-04-24, 22 y.o., female Today's Date: 10/29/2022  END OF SESSION:  PT End of Session - 10/29/22 1524     Visit Number 1    Authorization Type [place] Medicaid [employer]    PT Start Time 1401    PT Stop Time 1446    PT Time Calculation (min) 45 min    Activity Tolerance Patient tolerated treatment well    Behavior During Therapy [facility] for tasks assessed/performed             Past Medical History:  Diagnosis Date   Acne vulgaris 12/21/2013   Broken arm    left   BV (bacterial vaginosis)    Chlamydia    Constipation 05/16/2021   Depression    Irregular menstruation 03/15/2018   Sleep disturbance 05/16/2021   UTI (urinary tract infection)    Past Surgical History:  Procedure Laterality Date   TYMPANOSTOMY TUBE PLACEMENT     WISDOM TOOTH EXTRACTION     Patient Active Problem List   Diagnosis Date Noted   Supervision of normal pregnancy 07/16/2022   Adjustment disorder with depressed mood 02/29/2020    PCP: [name] MD  REFERRING PROVIDER: Lesly Dukes, MD  REFERRING DIAG: O99.891,M54.9 (ICD-10-CM) - Back pain affecting pregnancy M54.50 (ICD-10-CM) - Low back pain, unspecified  ; D66.440H (ICD-10-CM) - Strain of other specified muscles, fascia and tendons at thigh level, right thigh, initial encounter Rationale for Evaluation and Treatment: Rehabilitation  THERAPY DIAG:  Other low back pain  Other muscle spasm  Abnormal posture  Muscle weakness (generalized)  ONSET DATE: 08/2022  SUBJECTIVE:                                                                                                                                                                                           SUBJECTIVE STATEMENT: Patient presents with right sided back pain that started 08/2022 and it has gotten progressively worse since then. At night she gets cramps  on her Rt side and she was to walk around to relieve the pain. She walks a lot with ar her job and she is aware that she favors her Rt side more than her Lt. At work she knows that she does not wear the best shoes for walking (Vans). She has not been physically active much. Her boyfriend bought her a yoga ball and a foam roll but she uses them infrequently. Walking, sitting, and sleeping are the most limiting functional activities.   PERTINENT HISTORY:  Pt is [redacted] weeks pregnant; Depression  PAIN:  Are you  having pain? Yes: NPRS scale: 6(currently) 8(worst)/10 Pain location: Low back mainly right side; occasional cramps down right leg Pain description: achy; she feels like it needs to pop Aggravating factors: walking >1-hour; Figure four seated S Relieving factors: Stretching lunges; stretching adductors  PRECAUTIONS: Other: Pt is [redacted] weeks pregnant  RED FLAGS: None   WEIGHT BEARING RESTRICTIONS: No  FALLS:  Has patient fallen in last 6 months? 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 Plans to go back to the gym after pregnancy and live a healthy lifestyle. She wants to be able to tolerate walking more outside of what she does at work.  NEXT MD VISIT: 11/10/2022   OBJECTIVE:   DIAGNOSTIC FINDINGS:  None  PATIENT SURVEYS:  Modified Oswestry 22/50 44% disability   SCREENING FOR RED FLAGS: Bowel or bladder incontinence: No Spinal tumors: No Cauda equina syndrome: No Compression fracture: No Abdominal aneurysm: No  COGNITION: Overall cognitive status: Within functional limits for tasks assessed      POSTURE: rounded shoulders, forward head, increased lumbar lordosis, and anterior pelvic  tilt  PALPATION: Increased muscle spasm of Lt erector spinae; tenderness of Lt SIJ  LUMBAR ROM:   AROM eval  Flexion Bends to midshaft of  tibia  Extension 50% limited  Right lateral flexion Bends to knee joint line  Left lateral flexion Bends past knee joint line  Right rotation WFL  Left rotation WFL   (Blank rows = not tested)  LOWER EXTREMITY ROM:   WFL   LOWER EXTREMITY MMT:    MMT Right eval Left eval  Hip flexion 4- pain 4+  Hip extension    Hip abduction 4 4  Hip adduction 4+ 4+  Hip internal rotation    Hip external rotation    Knee flexion 4- 4+  Knee extension 5 5  Ankle dorsiflexion    Ankle plantarflexion    Ankle inversion    Ankle eversion     (Blank rows = not tested)    GAIT: Comments: Waddling gait; decreased bilateral step length  TODAY'S TREATMENT:                                                                                                                              DATE:   10/29/2022 HEP established  If treatment provided at initial evaluation, no treatment charged due to lack of authorization.       PATIENT EDUCATION:  Education details: Proper foot wear at work; lumbar mobility; seated posture Person educated: Patient Education method: Programmer, multimedia, Facilities manager, and Handouts Education comprehension: verbalized understanding, returned demonstration, and needs further education  HOME EXERCISE PROGRAM: Access Code: [email or web address] URL: [email or web address] Date: 10/29/2022 Prepared by: Claude Manges   Exercises - Seated  Diaphragmatic Breathing  - 1 x daily - 7 x weekly - 1 sets - 10 reps - Quadruped Abdominal and Pelvic Brace  - 1 x daily - 7 x weekly - 1 sets - 10 reps - Tail Wag  - 1 x daily - 7 x weekly - 1 sets - 10 reps - 5-7s holds - Quadruped Cat Cow  - 1 x daily - 7 x weekly - 1 sets - 10 reps - 5-7s holds - Unilateral Supported Supine Butterfly Stretch  - 1 x daily - 7 x weekly - 1 sets - 2-3 reps - 30-45s  holds - Supine Hip Internal and External Rotation  - 1 x daily - 7 x weekly - 1 sets - 2-3 reps - 30-45s holds - Seated Alternating Side Stretch with Arm Overhead  - 1 x daily - 7 x weekly - 1 sets - 2-3 reps - 30-45s holds   ASSESSMENT:  CLINICAL IMPRESSION: Patient is a 22 y.o. female who was seen today for physical therapy evaluation and treatment for right sided back pain. Based on evaluation noted decreased strength of hip musculature, poor posture, and decreased walking tolerance. These deficits are preventing Inetha from completing her work duties. She is not currently active but would like to do more. She would like to be able to tolerate walking longer than an hour. Educated patient on correct seated posture and proper shoes for work. Patient will benefit from skilled PT to address the below impairments and improve overall function.   OBJECTIVE IMPAIRMENTS: Abnormal gait, decreased activity tolerance, difficulty walking, decreased strength, increased muscle spasms, postural dysfunction, and pain.   ACTIVITY LIMITATIONS: sitting, sleeping, and bed mobility  PARTICIPATION LIMITATIONS: shopping, community activity, and occupation  PERSONAL FACTORS: Fitness are also affecting patient's functional outcome.   REHAB POTENTIAL: Good  CLINICAL DECISION MAKING: Stable/uncomplicated  EVALUATION COMPLEXITY: Low   GOALS: Goals reviewed with patient? Yes  SHORT TERM GOALS: Target date: 11/25/2022  Patient will be independent with initial HEP. Baseline:  Goal status: INITIAL  2.  Patient will report > or = to 25% improvement in sleep quality. Baseline: wakes up 2-3x during the night Goal status: INITIAL   3. Patient will report < or = to 6/10 back pain for improved walking tolerance. Baseline: 8/10 pain Goal status: INITIAL    LONG TERM GOALS: Target date: 12/23/2022  Patient will demonstrate independence in advanced HEP. Baseline:  Goal status: INITIAL   2.   Patient's ODI will decrease to < = to 12/50 for improved function. Baseline: 22/50 Goal status: INITIAL  3.  Patient will report < or = to 4/10 back pain for improved walking tolerance. Baseline: 8/10 pain Goal status: INITIAL  4.  Patient will be able to tolerate walking for > or = to 1.5 hours for improved functional mobility. Baseline: 1 hour Goal status: INITIAL   PLAN:  PT FREQUENCY: 2x/week  PT DURATION: 8 weeks  PLANNED INTERVENTIONS: Therapeutic exercises, Therapeutic activity, Neuromuscular re-education, Balance training, Gait training, Patient/Family education, Self Care, Joint mobilization, Joint manipulation, Vestibular training, Canalith repositioning, Aquatic Therapy, Dry Needling, Electrical stimulation, Spinal manipulation, Spinal mobilization, Cryotherapy, Moist heat, Taping, Ultrasound, Ionotophoresis 4mg /ml Dexamethasone, and Manual therapy.  PLAN FOR NEXT SESSION: Assess HEP, incorporate hip strengthening, progress core strengthening   Claude Manges, PT 10/29/22 5:14 PM  [facility] [address] [place], [place] [address] Phone # [phone] Fax [phone]

## 2022-10-29 ENCOUNTER — Encounter: Payer: Self-pay | Admitting: Physical Therapy

## 2022-10-29 ENCOUNTER — Other Ambulatory Visit: Payer: Self-pay

## 2022-10-29 ENCOUNTER — Ambulatory Visit: Payer: Medicaid Other | Attending: Obstetrics & Gynecology | Admitting: Physical Therapy

## 2022-10-29 DIAGNOSIS — S76811A Strain of other specified muscles, fascia and tendons at thigh level, right thigh, initial encounter: Secondary | ICD-10-CM | POA: Insufficient documentation

## 2022-10-29 DIAGNOSIS — M5459 Other low back pain: Secondary | ICD-10-CM

## 2022-10-29 DIAGNOSIS — M62838 Other muscle spasm: Secondary | ICD-10-CM

## 2022-10-29 DIAGNOSIS — M545 Low back pain, unspecified: Secondary | ICD-10-CM | POA: Insufficient documentation

## 2022-10-29 DIAGNOSIS — O99891 Other specified diseases and conditions complicating pregnancy: Secondary | ICD-10-CM | POA: Diagnosis not present

## 2022-10-29 DIAGNOSIS — X58XXXA Exposure to other specified factors, initial encounter: Secondary | ICD-10-CM | POA: Insufficient documentation

## 2022-10-29 DIAGNOSIS — R293 Abnormal posture: Secondary | ICD-10-CM

## 2022-10-29 DIAGNOSIS — M6281 Muscle weakness (generalized): Secondary | ICD-10-CM | POA: Diagnosis not present

## 2022-11-10 ENCOUNTER — Ambulatory Visit: Payer: Medicaid Other | Admitting: Obstetrics & Gynecology

## 2022-11-10 VITALS — BP 110/67 | HR 88 | Wt 198.0 lb

## 2022-11-10 DIAGNOSIS — Z23 Encounter for immunization: Secondary | ICD-10-CM

## 2022-11-10 DIAGNOSIS — Z3400 Encounter for supervision of normal first pregnancy, unspecified trimester: Secondary | ICD-10-CM

## 2022-11-10 DIAGNOSIS — Z3A30 30 weeks gestation of pregnancy: Secondary | ICD-10-CM

## 2022-11-10 DIAGNOSIS — Z3483 Encounter for supervision of other normal pregnancy, third trimester: Secondary | ICD-10-CM

## 2022-11-10 NOTE — Progress Notes (Signed)
   PRENATAL VISIT NOTE  Subjective:  Theresa Hubbard is a 22 y.o. G2P0010 at [redacted]w[redacted]d being seen today for ongoing prenatal care.  She is currently monitored for the following issues for this low-risk pregnancy and has Adjustment disorder with depressed mood and Supervision of normal pregnancy on their problem list.  Patient reports  back pain--better on PT days; wearing better shoes at work and helps with pain.  .  Contractions: Not present. Vag. Bleeding: None.  Movement: Present. Denies leaking of fluid.   The following portions of the patient's history were reviewed and updated as appropriate: allergies, current medications, past family history, past medical history, past social history, past surgical history and problem list.   Objective:   Vitals:   11/10/22 0933  BP: 110/67  Pulse: 88  Weight: 198 lb (89.8 kg)    Fetal Status: Fetal Heart Rate (bpm): 136   Movement: Present     General:  Alert, oriented and cooperative. Patient is in no acute distress.  Skin: Skin is warm and dry. No rash noted.   Cardiovascular: Normal heart rate noted  Respiratory: Normal respiratory effort, no problems with respiration noted  Abdomen: Soft, gravid, appropriate for gestational age.  Pain/Pressure: Absent     Pelvic: Cervical exam deferred        Extremities: Normal range of motion.  Edema: None  Mental Status: Normal mood and affect. Normal behavior. Normal judgment and thought content.   Assessment and Plan:  Pregnancy: G2P0010 at [redacted]w[redacted]d 1. Supervision of normal first pregnancy, antepartum - Tdap vaccine greater than or equal to 7yo IM - Flu vaccine trivalent PF, 6mos and older(Flulaval,Afluria,Fluarix,Fluzone)  2.  Back Pain--Saw PT and had eval and treatment program initiated.   Preterm labor symptoms and general obstetric precautions including but not limited to vaginal bleeding, contractions, leaking of fluid and fetal movement were reviewed in detail with the patient. Please  refer to After Visit Summary for other counseling recommendations.   No follow-ups on file.  Future Appointments  Date Time Provider Department Center  11/11/2022  9:30 AM Claude Manges, PT [facility] None  11/20/2022 11:45 AM Claude Manges, PT [facility] None  11/26/2022 11:45 AM Claude Manges, PT [facility] None  12/04/2022 12:30 PM Claude Manges, PT [facility] None  12/10/2022 11:45 AM Claude Manges, PT [facility] None  12/18/2022 11:45 AM Claude Manges, PT [facility] None  12/24/2022 11:45 AM Claude Manges, PT [facility] None    Elsie Lincoln, MD

## 2022-11-11 ENCOUNTER — Encounter: Payer: Self-pay | Admitting: Physical Therapy

## 2022-11-11 ENCOUNTER — Ambulatory Visit: Payer: Medicaid Other | Attending: Obstetrics & Gynecology | Admitting: Physical Therapy

## 2022-11-11 DIAGNOSIS — M6281 Muscle weakness (generalized): Secondary | ICD-10-CM | POA: Diagnosis not present

## 2022-11-11 DIAGNOSIS — M5459 Other low back pain: Secondary | ICD-10-CM | POA: Diagnosis not present

## 2022-11-11 DIAGNOSIS — M62838 Other muscle spasm: Secondary | ICD-10-CM | POA: Insufficient documentation

## 2022-11-11 DIAGNOSIS — R293 Abnormal posture: Secondary | ICD-10-CM | POA: Diagnosis not present

## 2022-11-11 NOTE — Therapy (Signed)
OUTPATIENT PHYSICAL THERAPY THORACOLUMBAR TREATMENT   Patient Name: Theresa Hubbard MRN: [id number] DOB:[birth year], 22 y.o., female Today's Date: 11/11/2022  END OF SESSION:  PT End of Session - 11/11/22 1013     Visit Number 2    Authorization Type [place] Medicaid Healthy Blue    Authorization Time Period 10/29/2022-12/27/2022    Authorization - Visit Number 7    PT Start Time 0934    PT Stop Time 1015    PT Time Calculation (min) 41 min    Activity Tolerance Patient tolerated treatment well    Behavior During Therapy [facility] for tasks assessed/performed              Past Medical History:  Diagnosis Date   Acne vulgaris 12/21/2013   Broken arm    left   BV (bacterial vaginosis)    Chlamydia    Constipation 05/16/2021   Depression    Irregular menstruation 03/15/2018   Sleep disturbance 05/16/2021   UTI (urinary tract infection)    Past Surgical History:  Procedure Laterality Date   TYMPANOSTOMY TUBE PLACEMENT     WISDOM TOOTH EXTRACTION     Patient Active Problem List   Diagnosis Date Noted   Supervision of normal pregnancy 07/16/2022   Adjustment disorder with depressed mood 02/29/2020    PCP: [name] MD  REFERRING PROVIDER: Lesly Dukes, MD  REFERRING DIAG: O99.891,M54.9 (ICD-10-CM) - Back pain affecting pregnancy M54.50 (ICD-10-CM) - Low back pain, unspecified  ; E95.284X (ICD-10-CM) - Strain of other specified muscles, fascia and tendons at thigh level, right thigh, initial encounter Rationale for Evaluation and Treatment: Rehabilitation  THERAPY DIAG:  Other low back pain  Other muscle spasm  Abnormal posture  Muscle weakness (generalized)  ONSET DATE: 08/2022  SUBJECTIVE:                                                                                                                                                                                           SUBJECTIVE STATEMENT: Patient reports her back is feeling better overall.  Currently 5/10. She has been doing her HEP and they have have been going well. [place] and her legs felt very weak and her hand swelled. She has been wearing more supportive shoes at work and she has noticed an improvement in her back pain.  PERTINENT HISTORY:  Pt is [redacted] weeks pregnant; Depression  PAIN:  Are you having pain? Yes: NPRS scale: 6(currently) 8(worst)/10 Pain location: Low back mainly right side; occasional cramps down right leg Pain description: achy; she feels like it needs to pop Aggravating factors: walking >  1-hour; Figure four seated S Relieving factors: Stretching lunges; stretching adductors  PRECAUTIONS: Other: Pt is [redacted] weeks pregnant  RED FLAGS: None   WEIGHT BEARING RESTRICTIONS: No  FALLS:  Has patient fallen in last 6 months? 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 Plans to go back to the gym after pregnancy and live a healthy lifestyle. She wants to be able to tolerate walking more outside of what she does at work.  NEXT MD VISIT: 11/10/2022   OBJECTIVE:   DIAGNOSTIC FINDINGS:  None  PATIENT SURVEYS:  Modified Oswestry 22/50 44% disability   SCREENING FOR RED FLAGS: Bowel or bladder incontinence: No Spinal tumors: No Cauda equina syndrome: No Compression fracture: No Abdominal aneurysm: No  COGNITION: Overall cognitive status: Within functional limits for tasks assessed      POSTURE: rounded shoulders, forward head, increased lumbar lordosis, and anterior pelvic tilt  PALPATION: Increased muscle spasm of Lt erector spinae; tenderness of Lt SIJ  LUMBAR ROM:   AROM eval  Flexion Bends to midshaft of  tibia  Extension 50% limited  Right  lateral flexion Bends to knee joint line  Left lateral flexion Bends past knee joint line  Right rotation WFL  Left rotation WFL   (Blank rows = not tested)  LOWER EXTREMITY ROM:   WFL   LOWER EXTREMITY MMT:    MMT Right eval Left eval  Hip flexion 4- pain 4+  Hip extension    Hip abduction 4 4  Hip adduction 4+ 4+  Hip internal rotation    Hip external rotation    Knee flexion 4- 4+  Knee extension 5 5  Ankle dorsiflexion    Ankle plantarflexion    Ankle inversion    Ankle eversion     (Blank rows = not tested)    GAIT: Comments: Waddling gait; decreased bilateral step length  TODAY'S TREATMENT:                                                                                                                              DATE:  11/11/2022 Review HEP exercises Clamshells 2 x 10 each side Reverse clamshells 2 x 10 each side Seated Hamstring stretch 2 x 30 sec each Standing monster walks blue loop x 4 Standing hip extension blue loop 2 x 10 each leg Posterior pelvic tilt on SB 2 x 10  10/29/2022 HEP established If treatment provided at initial evaluation, no treatment charged due to lack of authorization.       PATIENT EDUCATION:  Education details: Proper foot wear at work; lumbar mobility; seated posture Person educated: Patient Education method: Explanation, Facilities manager, and Handouts Education comprehension: verbalized understanding, returned demonstration, and needs further education  HOME EXERCISE  PROGRAM: Access Code: [email or web address] URL: [email or web address] Date: 10/29/2022 Prepared by: Claude Manges   Exercises - Seated Diaphragmatic Breathing  - 1 x daily - 7 x weekly - 1 sets - 10 reps - Quadruped Abdominal and Pelvic Brace  - 1 x daily - 7 x weekly - 1 sets - 10 reps - Tail Wag  - 1 x daily - 7 x weekly - 1 sets - 10 reps - 5-7s holds - Quadruped Cat Cow  - 1 x daily - 7 x weekly - 1 sets - 10 reps - 5-7s holds - Unilateral Supported  Supine Butterfly Stretch  - 1 x daily - 7 x weekly - 1 sets - 2-3 reps - 30-45s holds - Supine Hip Internal and External Rotation  - 1 x daily - 7 x weekly - 1 sets - 2-3 reps - 30-45s holds - Seated Alternating Side Stretch with Arm Overhead  - 1 x daily - 7 x weekly - 1 sets - 2-3 reps - 30-45s holds   ASSESSMENT:  CLINICAL IMPRESSION: Today's treatment session focused on hip stability and core strengthening. Reviewed patient's HEP to ensure proper performance. With sidelying exercises patient verbalized feeling weakness in her hips. Patient required verbal and tactile cues for proper upright posture to alleviate back pain. Patient tolerated treatment session well and did not verbalize any increased pain. Patient will benefit from skilled PT to address the below impairments and improve overall function.    OBJECTIVE IMPAIRMENTS: Abnormal gait, decreased activity tolerance, difficulty walking, decreased strength, increased muscle spasms, postural dysfunction, and pain.   ACTIVITY LIMITATIONS: sitting, sleeping, and bed mobility  PARTICIPATION LIMITATIONS: shopping, community activity, and occupation  PERSONAL FACTORS: Fitness are also affecting patient's functional outcome.   REHAB POTENTIAL: Good  CLINICAL DECISION MAKING: Stable/uncomplicated  EVALUATION COMPLEXITY: Low   GOALS: Goals reviewed with patient? Yes  SHORT TERM GOALS: Target date: 11/25/2022  Patient will be independent with initial HEP. Baseline:  Goal status: INITIAL  2.  Patient will report > or = to 25% improvement in sleep quality. Baseline: wakes up 2-3x during the night Goal status: INITIAL   3. Patient will report < or = to 6/10 back pain for improved walking tolerance. Baseline: 8/10 pain Goal status: INITIAL    LONG TERM GOALS: Target date: 12/23/2022  Patient will demonstrate independence in advanced HEP. Baseline:  Goal status: INITIAL   2.  Patient's ODI will decrease to < = to 12/50  for improved function. Baseline: 22/50 Goal status: INITIAL  3.  Patient will report < or = to 4/10 back pain for improved walking tolerance. Baseline: 8/10 pain Goal status: INITIAL  4.  Patient will be able to tolerate walking for > or = to 1.5 hours for improved functional mobility. Baseline: 1 hour Goal status: INITIAL   PLAN:  PT FREQUENCY: 2x/week  PT DURATION: 8 weeks  PLANNED INTERVENTIONS: Therapeutic exercises, Therapeutic activity, Neuromuscular re-education, Balance training, Gait training, Patient/Family education, Self Care, Joint mobilization, Joint manipulation, Vestibular training, Canalith repositioning, Aquatic Therapy, Dry Needling, Electrical stimulation, Spinal manipulation, Spinal mobilization, Cryotherapy, Moist heat, Taping, Ultrasound, Ionotophoresis 4mg /ml Dexamethasone, and Manual therapy.  PLAN FOR NEXT SESSION: Progress core & hip strengthening as tolerated    Claude Manges, PT 11/11/22 10:14 AM  [facility] [address], [address] [place], [place] [address] Phone # [phone] Fax [phone]

## 2022-11-20 ENCOUNTER — Ambulatory Visit: Payer: Medicaid Other | Admitting: Physical Therapy

## 2022-11-20 ENCOUNTER — Encounter: Payer: Self-pay | Admitting: Physical Therapy

## 2022-11-20 DIAGNOSIS — M6281 Muscle weakness (generalized): Secondary | ICD-10-CM | POA: Diagnosis not present

## 2022-11-20 DIAGNOSIS — M5459 Other low back pain: Secondary | ICD-10-CM | POA: Diagnosis not present

## 2022-11-20 DIAGNOSIS — R293 Abnormal posture: Secondary | ICD-10-CM

## 2022-11-20 DIAGNOSIS — M62838 Other muscle spasm: Secondary | ICD-10-CM | POA: Diagnosis not present

## 2022-11-20 NOTE — Therapy (Addendum)
OUTPATIENT PHYSICAL THERAPY THORACOLUMBAR TREATMENT   Patient Name: Theresa Hubbard MRN: [id number] DOB:25-Nov-2000, 22 y.o., female Today's Date: 11/20/2022  END OF SESSION:  PT End of Session - 11/20/22 1230     Visit Number 3    Date for PT Re-Evaluation 12/24/22    Authorization Type [place] Medicaid Healthy Blue    Authorization Time Period 10/29/2022-12/27/2022    Authorization - Visit Number 7    Authorization - Number of Visits 2    PT Start Time 1147    PT Stop Time 1230    PT Time Calculation (min) 43 min    Activity Tolerance Patient tolerated treatment well    Behavior During Therapy [facility] for tasks assessed/performed               Past Medical History:  Diagnosis Date   Acne vulgaris 12/21/2013   Broken arm    left   BV (bacterial vaginosis)    Chlamydia    Constipation 05/16/2021   Depression    Irregular menstruation 03/15/2018   Sleep disturbance 05/16/2021   UTI (urinary tract infection)    Past Surgical History:  Procedure Laterality Date   TYMPANOSTOMY TUBE PLACEMENT     WISDOM TOOTH EXTRACTION     Patient Active Problem List   Diagnosis Date Noted   Supervision of normal pregnancy 07/16/2022   Adjustment disorder with depressed mood 02/29/2020    PCP: [name] MD  REFERRING PROVIDER: Lesly Dukes, MD  REFERRING DIAG: O99.891,M54.9 (ICD-10-CM) - Back pain affecting pregnancy M54.50 (ICD-10-CM) - Low back pain, unspecified  ; D66.440H (ICD-10-CM) - Strain of other specified muscles, fascia and tendons at thigh level, right thigh, initial encounter Rationale for Evaluation and Treatment: Rehabilitation  THERAPY DIAG:  Other low back pain  Other muscle spasm  Abnormal posture  Muscle weakness (generalized)  ONSET DATE: 08/2022  SUBJECTIVE:                                                                                                                                                                                            SUBJECTIVE STATEMENT: Patient reports she is doing good today. Her job approved her to get a yoga ball at work and that has been helpful. She is currently having 4/10 pain.  PERTINENT HISTORY:  Pt is [redacted] weeks pregnant; Depression  PAIN: 11/20/2022 Are you having pain? Yes: NPRS scale: 4/10 Pain location: Low back mainly right side; occasional cramps down right leg Pain description: achy; she feels like it needs to pop Aggravating factors: walking >1-hour; Figure four seated S Relieving factors: Stretching lunges; stretching adductors  PRECAUTIONS: Other:  Pt is [redacted] weeks pregnant  RED FLAGS: None   WEIGHT BEARING RESTRICTIONS: No  FALLS:  Has patient fallen in last 6 months? 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 Plans to go back to the gym after pregnancy and live a healthy lifestyle. She wants to be able to tolerate walking more outside of what she does at work.  NEXT MD VISIT: 11/10/2022   OBJECTIVE:   DIAGNOSTIC FINDINGS:  None  PATIENT SURVEYS:  Modified Oswestry 22/50 44% disability   SCREENING FOR RED FLAGS: Bowel or bladder incontinence: No Spinal tumors: No Cauda equina syndrome: No Compression fracture: No Abdominal aneurysm: No  COGNITION: Overall cognitive status: Within functional limits for tasks assessed      POSTURE: rounded shoulders, forward head, increased lumbar lordosis, and anterior pelvic tilt  PALPATION: Increased muscle spasm of Lt erector spinae; tenderness of Lt SIJ  LUMBAR ROM:   AROM eval  Flexion Bends to midshaft of  tibia  Extension 50% limited  Right lateral flexion Bends to knee joint line  Left lateral flexion Bends past knee joint line  Right rotation  WFL  Left rotation WFL   (Blank rows = not tested)  LOWER EXTREMITY ROM:   WFL   LOWER EXTREMITY MMT:    MMT Right eval Left eval  Hip flexion 4- pain 4+  Hip extension    Hip abduction 4 4  Hip adduction 4+ 4+  Hip internal rotation    Hip external rotation    Knee flexion 4- 4+  Knee extension 5 5  Ankle dorsiflexion    Ankle plantarflexion    Ankle inversion    Ankle eversion     (Blank rows = not tested)    GAIT: Comments: Waddling gait; decreased bilateral step length  TODAY'S TREATMENT:                                                                                                                              DATE:  11/20/2022 NuStep level 3 7 minutes- PT present to discuss status Standing hip flexor stretch on stair 2 x 30 Standing hamstring stretch on stair 2 x 30 Quadruped TA contraction + pelvic floor contraction Supine butterfly + pelvic floor contraction Hooklying march with green loop x 10 each Clamshells 2 x 10 each side Reverse clamshells 2 x 10 each side Pelvic Tilts on SB     11/11/2022 Review HEP exercises Clamshells 2 x 10 each side Reverse clamshells 2 x 10 each side Seated Hamstring stretch 2 x 30 sec each Standing monster walks blue loop x 4 Standing hip extension blue loop 2 x 10 each leg Posterior pelvic tilt on SB 2 x 10  10/29/2022 HEP established  If treatment provided at initial evaluation, no treatment charged due to lack of authorization.       PATIENT EDUCATION:  Education details: Proper foot wear at work; lumbar mobility; seated posture Person educated: Patient Education method: Programmer, multimedia, Facilities manager, and Handouts Education comprehension: verbalized understanding, returned demonstration, and needs further education  HOME EXERCISE PROGRAM: Access Code: [email or web address] URL: [email or web address] Date: 10/29/2022 Prepared by: Claude Manges   Exercises - Seated Diaphragmatic Breathing  - 1 x daily - 7 x  weekly - 1 sets - 10 reps - Quadruped Abdominal and Pelvic Brace  - 1 x daily - 7 x weekly - 1 sets - 10 reps - Tail Wag  - 1 x daily - 7 x weekly - 1 sets - 10 reps - 5-7s holds - Quadruped Cat Cow  - 1 x daily - 7 x weekly - 1 sets - 10 reps - 5-7s holds - Unilateral Supported Supine Butterfly Stretch  - 1 x daily - 7 x weekly - 1 sets - 2-3 reps - 30-45s holds - Supine Hip Internal and External Rotation  - 1 x daily - 7 x weekly - 1 sets - 2-3 reps - 30-45s holds - Seated Alternating Side Stretch with Arm Overhead  - 1 x daily - 7 x weekly - 1 sets - 2-3 reps - 30-45s holds   ASSESSMENT:  CLINICAL IMPRESSION: Today's treatment session focused on core strengthening and hip stability. Patient verbalized she feels better since started therapy and HEP is helpful. Required moderate verbal cues for improved TA and pelvic floor muscle contraction. Progressed patient's core exercises to include movement of LE. Patient tolerated treatment session well and did not verbalized any increased pain. Patient has met 2/3 short term goals at this time.  Patient will benefit from skilled PT to address the below impairments and improve overall function.    OBJECTIVE IMPAIRMENTS: Abnormal gait, decreased activity tolerance, difficulty walking, decreased strength, increased muscle spasms, postural dysfunction, and pain.   ACTIVITY LIMITATIONS: sitting, sleeping, and bed mobility  PARTICIPATION LIMITATIONS: shopping, community activity, and occupation  PERSONAL FACTORS: Fitness are also affecting patient's functional outcome.   REHAB POTENTIAL: Good  CLINICAL DECISION MAKING: Stable/uncomplicated  EVALUATION COMPLEXITY: Low   GOALS: Goals reviewed with patient? Yes  SHORT TERM GOALS: Target date: 11/25/2022  Patient will be independent with initial HEP. Baseline:  Goal status: MET 11/20/2022  2.  Patient will report > or = to 25% improvement in sleep quality. Baseline: wakes up 2-3x during the  night Goal status: MET 11/20/2022 Patient reports 50% improvement in sleep quality    3. Patient will report < or = to 6/10 back pain for improved walking tolerance. Baseline: 8/10 pain Goal status: IN PROGRESS ; pain fluctuates     LONG TERM GOALS: Target date: 12/23/2022  Patient will demonstrate independence in advanced HEP. Baseline:  Goal status: INITIAL   2.  Patient's ODI will decrease to < = to 12/50 for improved function. Baseline: 22/50 Goal status: INITIAL  3.  Patient will report < or = to 4/10 back pain for improved walking tolerance. Baseline: 8/10 pain Goal status: INITIAL  4.  Patient will be able to tolerate walking for > or = to 1.5 hours for improved functional mobility. Baseline: 1 hour Goal status: INITIAL   PLAN:  PT FREQUENCY: 2x/week  PT DURATION: 8 weeks  PLANNED INTERVENTIONS: Therapeutic exercises, Therapeutic activity, Neuromuscular re-education, Balance training, Gait training, Patient/Family education, Self Care, Joint mobilization, Joint manipulation, Vestibular training, Canalith  repositioning, Aquatic Therapy, Dry Needling, Electrical stimulation, Spinal manipulation, Spinal mobilization, Cryotherapy, Moist heat, Taping, Ultrasound, Ionotophoresis 4mg /ml Dexamethasone, and Manual therapy.  PLAN FOR NEXT SESSION: Incorporate more standing core exercises; continue hip stability   Claude Manges, PT 11/20/22 12:32 PM   [facility] [address], [address] [place], [place] [address] Phone # [phone] Fax [phone]

## 2022-11-25 ENCOUNTER — Ambulatory Visit (INDEPENDENT_AMBULATORY_CARE_PROVIDER_SITE_OTHER): Payer: Medicaid Other | Admitting: Obstetrics and Gynecology

## 2022-11-25 VITALS — BP 115/71 | HR 105 | Wt 201.0 lb

## 2022-11-25 DIAGNOSIS — Z3400 Encounter for supervision of normal first pregnancy, unspecified trimester: Secondary | ICD-10-CM

## 2022-11-25 NOTE — Progress Notes (Signed)
   PRENATAL VISIT NOTE  Subjective:  Theresa Hubbard is a 22 y.o. G2P0010 at [redacted]w[redacted]d being seen today for ongoing prenatal care.  She is currently monitored for the following issues for this low-risk pregnancy and has Adjustment disorder with depressed mood and Supervision of normal pregnancy on their problem list.  Patient reports  congestion  .  Contractions: Not present. Vag. Bleeding: None.  Movement: Present. Denies leaking of fluid.   The following portions of the patient's history were reviewed and updated as appropriate: allergies, current medications, past family history, past medical history, past social history, past surgical history and problem list.   Objective:   Vitals:   11/25/22 1334  BP: 115/71  Pulse: (!) 105  Weight: 201 lb (91.2 kg)    Fetal Status: Fetal Heart Rate (bpm): 145 Fundal Height: 32 cm Movement: Present     General:  Alert, oriented and cooperative. Patient is in no acute distress.  Skin: Skin is warm and dry. No rash noted.   Cardiovascular: Normal heart rate noted  Respiratory: Normal respiratory effort, no problems with respiration noted  Abdomen: Soft, gravid, appropriate for gestational age.  Pain/Pressure: Absent     Pelvic: Cervical exam deferred        Extremities: Normal range of motion.  Edema: Trace  Mental Status: Normal mood and affect. Normal behavior. Normal judgment and thought content.   Assessment and Plan:  Pregnancy: G2P0010 at [redacted]w[redacted]d  1. Supervision of normal first pregnancy, antepartum  Ok to use OTC Nasacort or Flonase. Ocean spray and cool mist humidifier.  Doing well otherwise. MFM [place] normal.   Preterm labor symptoms and general obstetric precautions including but not limited to vaginal bleeding, contractions, leaking of fluid and fetal movement were reviewed in detail with the patient. Please refer to After Visit Summary for other counseling recommendations.   No follow-ups on file.  Future Appointments   Date Time Provider Department Center  11/26/2022 11:45 AM Claude Manges, PT [facility] None  12/04/2022 12:30 PM Claude Manges, PT [facility] None  12/09/2022  9:10 AM Derin Granquist, Harolyn Rutherford, NP [facility]  12/10/2022 11:45 AM Claude Manges, PT [facility] None  12/18/2022 11:45 AM Claude Manges, PT [facility] None  12/23/2022  8:30 AM Julena Barbour, Harolyn Rutherford, NP [facility]  12/24/2022 11:45 AM Claude Manges, PT [facility] None  12/30/2022  8:30 AM Shaylin Blatt, Harolyn Rutherford, NP [facility]    Venia Carbon, NP

## 2022-11-26 ENCOUNTER — Ambulatory Visit: Payer: Medicaid Other | Admitting: Physical Therapy

## 2022-11-26 ENCOUNTER — Encounter: Payer: Self-pay | Admitting: Physical Therapy

## 2022-11-26 DIAGNOSIS — M62838 Other muscle spasm: Secondary | ICD-10-CM | POA: Diagnosis not present

## 2022-11-26 DIAGNOSIS — R293 Abnormal posture: Secondary | ICD-10-CM | POA: Diagnosis not present

## 2022-11-26 DIAGNOSIS — M5459 Other low back pain: Secondary | ICD-10-CM | POA: Diagnosis not present

## 2022-11-26 DIAGNOSIS — M6281 Muscle weakness (generalized): Secondary | ICD-10-CM | POA: Diagnosis not present

## 2022-11-26 NOTE — Therapy (Signed)
OUTPATIENT PHYSICAL THERAPY THORACOLUMBAR TREATMENT   Patient Name: Theresa Hubbard MRN: [id number] DOB:2000-08-13, 22 y.o., female Today's Date: 11/26/2022  END OF SESSION:  PT End of Session - 11/26/22 1229     Visit Number 4    Date for PT Re-Evaluation 12/24/22    Authorization Type [place] [employer]    Authorization Time Period 10/29/2022-12/27/2022    Authorization - Visit Number 7    Authorization - Number of Visits 3    PT Start Time 1146    PT Stop Time 1228    PT Time Calculation (min) 42 min    Activity Tolerance Patient tolerated treatment well    Behavior During Therapy [facility] for tasks assessed/performed                Past Medical History:  Diagnosis Date   Acne vulgaris 12/21/2013   Broken arm    left   BV (bacterial vaginosis)    Chlamydia    Constipation 05/16/2021   Depression    Irregular menstruation 03/15/2018   Sleep disturbance 05/16/2021   UTI (urinary tract infection)    Past Surgical History:  Procedure Laterality Date   TYMPANOSTOMY TUBE PLACEMENT     WISDOM TOOTH EXTRACTION     Patient Active Problem List   Diagnosis Date Noted   Supervision of normal pregnancy 07/16/2022   Adjustment disorder with depressed mood 02/29/2020    PCP: [name] MD  REFERRING PROVIDER: Lesly Dukes, MD  REFERRING DIAG: O99.891,M54.9 (ICD-10-CM) - Back pain affecting pregnancy M54.50 (ICD-10-CM) - Low back pain, unspecified  ; P29.518A (ICD-10-CM) - Strain of other specified muscles, fascia and tendons at thigh level, right thigh, initial encounter Rationale for Evaluation and Treatment: Rehabilitation  THERAPY DIAG:  Other low back pain  Other muscle spasm  Abnormal posture  Muscle weakness (generalized)  ONSET DATE: 08/2022  SUBJECTIVE:                                                                                                                                                                                            SUBJECTIVE STATEMENT: Patient reports she is doing good today. Her legs feel a little tired because she has been walking a lot at work. She feels tightness in her adductors.  PERTINENT HISTORY:  Pt is [redacted] weeks pregnant; Depression  PAIN: 11/20/2022 Are you having pain? Yes: NPRS scale: 4/10 Pain location: Low back mainly right side; occasional cramps down right leg Pain description: achy; she feels like it needs to pop Aggravating factors: walking >1-hour; Figure four seated S Relieving factors: Stretching lunges; stretching adductors  PRECAUTIONS: Other:  Pt is [redacted] weeks pregnant  RED FLAGS: None   WEIGHT BEARING RESTRICTIONS: No  FALLS:  Has patient fallen in last 6 months? 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 Plans to go back to the gym after pregnancy and live a healthy lifestyle. She wants to be able to tolerate walking more outside of what she does at work.  NEXT MD VISIT: 11/10/2022   OBJECTIVE:   DIAGNOSTIC FINDINGS:  None  PATIENT SURVEYS:  Modified Oswestry 22/50 44% disability   SCREENING FOR RED FLAGS: Bowel or bladder incontinence: No Spinal tumors: No Cauda equina syndrome: No Compression fracture: No Abdominal aneurysm: No  COGNITION: Overall cognitive status: Within functional limits for tasks assessed      POSTURE: rounded shoulders, forward head, increased lumbar lordosis, and anterior pelvic tilt  PALPATION: Increased muscle spasm of Lt erector spinae; tenderness of Lt SIJ  LUMBAR ROM:   AROM eval  Flexion Bends to midshaft of  tibia  Extension 50% limited  Right lateral flexion Bends to knee joint line  Left lateral flexion Bends past knee joint line  Right  rotation WFL  Left rotation WFL   (Blank rows = not tested)  LOWER EXTREMITY ROM:   WFL   LOWER EXTREMITY MMT:    MMT Right eval Left eval  Hip flexion 4- pain 4+  Hip extension    Hip abduction 4 4  Hip adduction 4+ 4+  Hip internal rotation    Hip external rotation    Knee flexion 4- 4+  Knee extension 5 5  Ankle dorsiflexion    Ankle plantarflexion    Ankle inversion    Ankle eversion     (Blank rows = not tested)    GAIT: Comments: Waddling gait; decreased bilateral step length  TODAY'S TREATMENT:                                                                                                                              DATE:  11/26/2022 NuStep level 3 6 minutes- PT present to discuss status Standing hip flexor stretch on stair 2 x 30 Standing hamstring stretch on stair 2 x 30 Quadruped TA contraction + pelvic floor contraction x 15 Quadruped tail wag x 10 Supine butterfly + pelvic floor contraction x 10 Hooklying march with green loop x 10 each Clamshells 2 x 10 each side with green loop Reverse clamshells 2 x 10 each with green lop Standing pallof press with red loop x 15 each direction Seated thoracic extension against foam roll x 10   11/20/2022 NuStep level 3 7 minutes- PT present to discuss status Standing hip flexor stretch on stair 2 x 30 Standing hamstring stretch on stair 2 x 30  Quadruped TA contraction + pelvic floor contraction Supine butterfly + pelvic floor contraction Hooklying march with green loop x 10 each Clamshells 2 x 10 each side Reverse clamshells 2 x 10 each side Pelvic Tilts on SB   11/11/2022 Review HEP exercises Clamshells 2 x 10 each side Reverse clamshells 2 x 10 each side Seated Hamstring stretch 2 x 30 sec each Standing monster walks blue loop x 4 Standing hip extension blue loop 2 x 10 each leg Posterior pelvic tilt on SB 2 x 10       PATIENT EDUCATION:  Education details: Proper foot wear at work; lumbar  mobility; seated posture Person educated: Patient Education method: Programmer, multimedia, Facilities manager, and Handouts Education comprehension: verbalized understanding, returned demonstration, and needs further education  HOME EXERCISE PROGRAM: Access Code: [email or web address] URL: [email or web address] Date: 10/29/2022 Prepared by: Claude Manges   Exercises - Seated Diaphragmatic Breathing  - 1 x daily - 7 x weekly - 1 sets - 10 reps - Quadruped Abdominal and Pelvic Brace  - 1 x daily - 7 x weekly - 1 sets - 10 reps - Tail Wag  - 1 x daily - 7 x weekly - 1 sets - 10 reps - 5-7s holds - Quadruped Cat Cow  - 1 x daily - 7 x weekly - 1 sets - 10 reps - 5-7s holds - Unilateral Supported Supine Butterfly Stretch  - 1 x daily - 7 x weekly - 1 sets - 2-3 reps - 30-45s holds - Supine Hip Internal and External Rotation  - 1 x daily - 7 x weekly - 1 sets - 2-3 reps - 30-45s holds - Seated Alternating Side Stretch with Arm Overhead  - 1 x daily - 7 x weekly - 1 sets - 2-3 reps - 30-45s holds   ASSESSMENT:  CLINICAL IMPRESSION: Today's treatment session focused on core strengthening and hip stability. Patient has not experienced any back pain within the last 7 days. She presented to therapy with tightness in her adductors after walking a lot today. She verbalized relief after doing hip mobility stretches. Progressed patient's hip exercise to include resistance and she tolerated exercises well. Patient will benefit from skilled PT to address the below impairments and improve overall function.    OBJECTIVE IMPAIRMENTS: Abnormal gait, decreased activity tolerance, difficulty walking, decreased strength, increased muscle spasms, postural dysfunction, and pain.   ACTIVITY LIMITATIONS: sitting, sleeping, and bed mobility  PARTICIPATION LIMITATIONS: shopping, community activity, and occupation  PERSONAL FACTORS: Fitness are also affecting patient's functional outcome.   REHAB POTENTIAL: Good  CLINICAL  DECISION MAKING: Stable/uncomplicated  EVALUATION COMPLEXITY: Low   GOALS: Goals reviewed with patient? Yes  SHORT TERM GOALS: Target date: 11/25/2022  Patient will be independent with initial HEP. Baseline:  Goal status: MET 11/20/2022  2.  Patient will report > or = to 25% improvement in sleep quality. Baseline: wakes up 2-3x during the night Goal status: MET 11/20/2022 Patient reports 50% improvement in sleep quality    3. Patient will report < or = to 6/10 back pain for improved walking tolerance. Baseline: 8/10 pain Goal status: IN PROGRESS ; pain fluctuates     LONG TERM GOALS: Target date: 12/23/2022  Patient will demonstrate independence in advanced HEP. Baseline:  Goal status: INITIAL   2.  Patient's ODI will decrease to < = to 12/50 for improved function. Baseline: 22/50 Goal status: INITIAL  3.  Patient will report < or = to 4/10 back pain for improved walking tolerance. Baseline: 8/10 pain  Goal status: INITIAL  4.  Patient will be able to tolerate walking for > or = to 1.5 hours for improved functional mobility. Baseline: 1 hour Goal status: INITIAL   PLAN:  PT FREQUENCY: 2x/week  PT DURATION: 8 weeks  PLANNED INTERVENTIONS: Therapeutic exercises, Therapeutic activity, Neuromuscular re-education, Balance training, Gait training, Patient/Family education, Self Care, Joint mobilization, Joint manipulation, Vestibular training, Canalith repositioning, Aquatic Therapy, Dry Needling, Electrical stimulation, Spinal manipulation, Spinal mobilization, Cryotherapy, Moist heat, Taping, Ultrasound, Ionotophoresis 4mg /ml Dexamethasone, and Manual therapy.  PLAN FOR NEXT SESSION: hip flexion on SB; standing hip flexion with unilateral KB hold; chops   Claude Manges, PT 11/26/22 12:30 PM  [facility] [address] [place], [place] [address] Phone # [phone] Fax [phone]

## 2022-12-04 ENCOUNTER — Ambulatory Visit: Payer: Medicaid Other | Admitting: Physical Therapy

## 2022-12-04 ENCOUNTER — Encounter: Payer: Self-pay | Admitting: Physical Therapy

## 2022-12-04 DIAGNOSIS — R293 Abnormal posture: Secondary | ICD-10-CM

## 2022-12-04 DIAGNOSIS — M62838 Other muscle spasm: Secondary | ICD-10-CM | POA: Diagnosis not present

## 2022-12-04 DIAGNOSIS — M5459 Other low back pain: Secondary | ICD-10-CM | POA: Diagnosis not present

## 2022-12-04 DIAGNOSIS — M6281 Muscle weakness (generalized): Secondary | ICD-10-CM | POA: Diagnosis not present

## 2022-12-04 NOTE — Therapy (Signed)
OUTPATIENT PHYSICAL THERAPY THORACOLUMBAR TREATMENT   Patient Name: Theresa Hubbard MRN: [id number] DOB:May 02, 2000, 22 y.o., female Today's Date: 12/04/2022  END OF SESSION:  PT End of Session - 12/04/22 1317     Visit Number 5    Date for PT Re-Evaluation 12/24/22    Authorization Type [place] [employer]    Authorization Time Period 10/29/2022-12/27/2022    Authorization - Visit Number 4    Authorization - Number of Visits 7    PT Start Time 1230    PT Stop Time 1315    PT Time Calculation (min) 45 min    Activity Tolerance Patient tolerated treatment well    Behavior During Therapy [facility] for tasks assessed/performed                 Past Medical History:  Diagnosis Date   Acne vulgaris 12/21/2013   Broken arm    left   BV (bacterial vaginosis)    Chlamydia    Constipation 05/16/2021   Depression    Irregular menstruation 03/15/2018   Sleep disturbance 05/16/2021   UTI (urinary tract infection)    Past Surgical History:  Procedure Laterality Date   TYMPANOSTOMY TUBE PLACEMENT     WISDOM TOOTH EXTRACTION     Patient Active Problem List   Diagnosis Date Noted   Supervision of normal pregnancy 07/16/2022   Adjustment disorder with depressed mood 02/29/2020    PCP: [name] MD  REFERRING PROVIDER: Lesly Dukes, MD  REFERRING DIAG: O99.891,M54.9 (ICD-10-CM) - Back pain affecting pregnancy M54.50 (ICD-10-CM) - Low back pain, unspecified  ; W09.811B (ICD-10-CM) - Strain of other specified muscles, fascia and tendons at thigh level, right thigh, initial encounter Rationale for Evaluation and Treatment: Rehabilitation  THERAPY DIAG:  Other low back pain  Other muscle spasm  Abnormal posture  ONSET DATE: 08/2022  SUBJECTIVE:                                                                                                                                                                                           SUBJECTIVE  STATEMENT: Patient reports she is doing good today. She has been waking up every morning to use the bathroom. Yesterday she had pain in her groin area that went away after using the massage gun.  PERTINENT HISTORY:  Pt is [redacted] weeks pregnant; Depression  PAIN: 11/20/2022 Are you having pain? Yes: NPRS scale: 4/10 Pain location: Low back mainly right side; occasional cramps down right leg Pain description: achy; she feels like it needs to pop Aggravating factors: walking >1-hour; Figure four seated S Relieving factors: Stretching lunges; stretching adductors  PRECAUTIONS: Other: Pt is [redacted] weeks pregnant  RED FLAGS: None   WEIGHT BEARING RESTRICTIONS: No  FALLS:  Has patient fallen in last 6 months? 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 Plans to go back to the gym after pregnancy and live a healthy lifestyle. She wants to be able to tolerate walking more outside of what she does at work.  NEXT MD VISIT: 11/10/2022   OBJECTIVE:   DIAGNOSTIC FINDINGS:  None  PATIENT SURVEYS:  Modified Oswestry 22/50 44% disability   SCREENING FOR RED FLAGS: Bowel or bladder incontinence: No Spinal tumors: No Cauda equina syndrome: No Compression fracture: No Abdominal aneurysm: No  COGNITION: Overall cognitive status: Within functional limits for tasks assessed      POSTURE: rounded shoulders, forward head, increased lumbar lordosis, and anterior pelvic tilt  PALPATION: Increased muscle spasm of Lt erector spinae; tenderness of Lt SIJ  LUMBAR ROM:   AROM eval  Flexion Bends to midshaft of  tibia  Extension 50% limited  Right lateral flexion Bends to knee joint line  Left lateral flexion Bends past knee joint line   Right rotation WFL  Left rotation WFL   (Blank rows = not tested)  LOWER EXTREMITY ROM:   WFL   LOWER EXTREMITY MMT:    MMT Right eval Left eval  Hip flexion 4- pain 4+  Hip extension    Hip abduction 4 4  Hip adduction 4+ 4+  Hip internal rotation    Hip external rotation    Knee flexion 4- 4+  Knee extension 5 5  Ankle dorsiflexion    Ankle plantarflexion    Ankle inversion    Ankle eversion     (Blank rows = not tested)    GAIT: Comments: Waddling gait; decreased bilateral step length  TODAY'S TREATMENT:                                                                                                                              DATE:  12/04/2022 NuStep level 3 6 minutes- PT present to discuss status Standing hip flexor stretch on stair 2 x 30 Standing hamstring stretch on stair 2 x 30 Quadruped TA contraction x 15 Quadruped tail wag x 10 Clamshells 2 x 10 each side with green loop Reverse clamshells 2 x 10 each with green lop Posterior pelvic tilts on SB x 20 Seated hip flexion on stability ball  2 x 10 each leg Standing pallof press with red loop x 20 each direction Chops with red tb x 15 each direction Seated thoracic extension against foam roll x 10   11/26/2022 NuStep level 3 6 minutes- PT present to discuss status Standing hip flexor stretch on stair 2 x  30 Standing hamstring stretch on stair 2 x 30 Quadruped TA contraction + pelvic floor contraction x 15 Quadruped tail wag x 10 Supine butterfly + pelvic floor contraction x 10 Hooklying march with green loop x 10 each Clamshells 2 x 10 each side with green loop Reverse clamshells 2 x 10 each with green lop Standing pallof press with red loop x 15 each direction Seated thoracic extension against foam roll x 10   11/20/2022 NuStep level 3 7 minutes- PT present to discuss status Standing hip flexor stretch on stair 2 x 30 Standing hamstring stretch on stair 2 x 30 Quadruped TA contraction +  pelvic floor contraction Supine butterfly + pelvic floor contraction Hooklying march with green loop x 10 each Clamshells 2 x 10 each side Reverse clamshells 2 x 10 each side Pelvic Tilts on SB     PATIENT EDUCATION:  Education details: Proper foot wear at work; lumbar mobility; seated posture Person educated: Patient Education method: Programmer, multimedia, Facilities manager, and Handouts Education comprehension: verbalized understanding, returned demonstration, and needs further education  HOME EXERCISE PROGRAM: Access Code: [email or web address] URL: [email or web address] Date: 10/29/2022 Prepared by: Claude Manges   Exercises - Seated Diaphragmatic Breathing  - 1 x daily - 7 x weekly - 1 sets - 10 reps - Quadruped Abdominal and Pelvic Brace  - 1 x daily - 7 x weekly - 1 sets - 10 reps - Tail Wag  - 1 x daily - 7 x weekly - 1 sets - 10 reps - 5-7s holds - Quadruped Cat Cow  - 1 x daily - 7 x weekly - 1 sets - 10 reps - 5-7s holds - Unilateral Supported Supine Butterfly Stretch  - 1 x daily - 7 x weekly - 1 sets - 2-3 reps - 30-45s holds - Supine Hip Internal and External Rotation  - 1 x daily - 7 x weekly - 1 sets - 2-3 reps - 30-45s holds - Seated Alternating Side Stretch with Arm Overhead  - 1 x daily - 7 x weekly - 1 sets - 2-3 reps - 30-45s holds   ASSESSMENT:  CLINICAL IMPRESSION: Today's treatment session focused on core strengthening and hip mobility. Progressed patient core exercises to an unstable surface and patient tolerated treatment session well. She has been waking up frequently to use the bathroom, but has not been experiencing any back pain. Patient required verbal and visual cues for correct exercise performance. Patient will benefit from skilled PT to address the below impairments and improve overall function.     OBJECTIVE IMPAIRMENTS: Abnormal gait, decreased activity tolerance, difficulty walking, decreased strength, increased muscle spasms, postural dysfunction, and  pain.   ACTIVITY LIMITATIONS: sitting, sleeping, and bed mobility  PARTICIPATION LIMITATIONS: shopping, community activity, and occupation  PERSONAL FACTORS: Fitness are also affecting patient's functional outcome.   REHAB POTENTIAL: Good  CLINICAL DECISION MAKING: Stable/uncomplicated  EVALUATION COMPLEXITY: Low   GOALS: Goals reviewed with patient? Yes  SHORT TERM GOALS: Target date: 11/25/2022  Patient will be independent with initial HEP. Baseline:  Goal status: MET 11/20/2022  2.  Patient will report > or = to 25% improvement in sleep quality. Baseline: wakes up 2-3x during the night Goal status: MET 11/20/2022 Patient reports 50% improvement in sleep quality    3. Patient will report < or = to 6/10 back pain for improved walking tolerance. Baseline: 8/10 pain Goal status: IN PROGRESS ; pain fluctuates     LONG TERM GOALS: Target date: 12/23/2022  Patient will demonstrate independence in  advanced HEP. Baseline:  Goal status: INITIAL   2.  Patient's ODI will decrease to < = to 12/50 for improved function. Baseline: 22/50 Goal status: INITIAL  3.  Patient will report < or = to 4/10 back pain for improved walking tolerance. Baseline: 8/10 pain Goal status: INITIAL  4.  Patient will be able to tolerate walking for > or = to 1.5 hours for improved functional mobility. Baseline: 1 hour Goal status: INITIAL   PLAN:  PT FREQUENCY: 2x/week  PT DURATION: 8 weeks  PLANNED INTERVENTIONS: Therapeutic exercises, Therapeutic activity, Neuromuscular re-education, Balance training, Gait training, Patient/Family education, Self Care, Joint mobilization, Joint manipulation, Vestibular training, Canalith repositioning, Aquatic Therapy, Dry Needling, Electrical stimulation, Spinal manipulation, Spinal mobilization, Cryotherapy, Moist heat, Taping, Ultrasound, Ionotophoresis 4mg /ml Dexamethasone, and Manual therapy.  PLAN FOR NEXT SESSION: continue core  strengthening  Claude Manges, PT 12/04/22 1:18 PM  [facility] [address] [place], [place] [address] Phone # [phone] Fax [phone]

## 2022-12-09 ENCOUNTER — Ambulatory Visit (INDEPENDENT_AMBULATORY_CARE_PROVIDER_SITE_OTHER): Payer: Medicaid Other | Admitting: Obstetrics and Gynecology

## 2022-12-09 VITALS — BP 105/70 | HR 111 | Wt 208.0 lb

## 2022-12-09 DIAGNOSIS — Z3A34 34 weeks gestation of pregnancy: Secondary | ICD-10-CM

## 2022-12-09 DIAGNOSIS — Z3483 Encounter for supervision of other normal pregnancy, third trimester: Secondary | ICD-10-CM

## 2022-12-09 DIAGNOSIS — Z3400 Encounter for supervision of normal first pregnancy, unspecified trimester: Secondary | ICD-10-CM

## 2022-12-09 NOTE — Progress Notes (Signed)
   PRENATAL VISIT NOTE  Subjective:  Theresa Hubbard is a 22 y.o. G2P0010 at [redacted]w[redacted]d being seen today for ongoing prenatal care.  She is currently monitored for the following issues for this low-risk pregnancy and has Adjustment disorder with depressed mood and Supervision of normal pregnancy on their problem list.  Patient reports bilateral inguinal pain.  Contractions: Not present. Vag. Bleeding: None.  Movement: Present. Denies leaking of fluid.   The following portions of the patient's history were reviewed and updated as appropriate: allergies, current medications, past family history, past medical history, past social history, past surgical history and problem list.   Objective:   Vitals:   12/09/22 0913  BP: 105/70  Pulse: (!) 111  Weight: 94.3 kg    Fetal Status: Fetal Heart Rate (bpm): 150   Movement: Present   Fundal height: 34 cm  General:  Alert, oriented and cooperative. Patient is in no acute distress.  Skin: Skin is warm and dry. No rash noted.   Cardiovascular: Normal heart rate noted  Respiratory: Normal respiratory effort, no problems with respiration noted  Abdomen: Soft, gravid, appropriate for gestational age.    Pelvic: Cervical exam deferred        Extremities: Normal range of motion.  Edema: Trace  Mental Status: Normal mood and affect. Normal behavior. Normal judgment and thought content.   Assessment and Plan:  Pregnancy: G2P0010 at [redacted]w[redacted]d   1. Supervision of normal first pregnancy, antepartum   Okay to use tylenol as needed for round ligament pain. Recommended maternity belt as well. Doing well otherwise.  Ultrasound on 8/16 normal   Preterm labor symptoms and general obstetric precautions including but not limited to vaginal bleeding, contractions, leaking of fluid and fetal movement were reviewed in detail with the patient. Please refer to After Visit Summary for other counseling recommendations.    Future Appointments  Date Time  Provider Department Center  12/10/2022 11:45 AM Claude Manges, PT [facility] None  12/18/2022 11:45 AM Claude Manges, PT [facility] None  12/23/2022  8:30 AM Rasch, Harolyn Rutherford, NP [facility]  12/24/2022 11:45 AM Claude Manges, PT [facility] None  12/30/2022  8:30 AM Rasch, Harolyn Rutherford, NP [facility]    Teja Judice D Shekelia Boutin, Student-PA

## 2022-12-10 ENCOUNTER — Encounter: Payer: Self-pay | Admitting: Physical Therapy

## 2022-12-10 ENCOUNTER — Ambulatory Visit: Payer: Medicaid Other | Attending: Obstetrics & Gynecology | Admitting: Physical Therapy

## 2022-12-10 DIAGNOSIS — M62838 Other muscle spasm: Secondary | ICD-10-CM | POA: Diagnosis not present

## 2022-12-10 DIAGNOSIS — M5459 Other low back pain: Secondary | ICD-10-CM | POA: Insufficient documentation

## 2022-12-10 DIAGNOSIS — M6281 Muscle weakness (generalized): Secondary | ICD-10-CM | POA: Diagnosis not present

## 2022-12-10 DIAGNOSIS — R293 Abnormal posture: Secondary | ICD-10-CM | POA: Insufficient documentation

## 2022-12-10 NOTE — Therapy (Signed)
OUTPATIENT PHYSICAL THERAPY THORACOLUMBAR TREATMENT   Patient Name: Theresa Hubbard MRN: [id number] DOB:2000-08-08, 22 y.o., female Today's Date: 12/10/2022  END OF SESSION:  PT End of Session - 12/10/22 1228     Visit Number 6    Date for PT Re-Evaluation 12/24/22    Authorization Type [place] Medicaid [employer]    Authorization Time Period 10/29/2022-12/27/2022    Authorization - Visit Number 5    Authorization - Number of Visits 7    PT Start Time 1146    PT Stop Time 1231    PT Time Calculation (min) 45 min    Activity Tolerance Patient tolerated treatment well    Behavior During Therapy [facility] for tasks assessed/performed                  Past Medical History:  Diagnosis Date   Acne vulgaris 12/21/2013   Broken arm    left   BV (bacterial vaginosis)    Chlamydia    Constipation 05/16/2021   Depression    Irregular menstruation 03/15/2018   Sleep disturbance 05/16/2021   UTI (urinary tract infection)    Past Surgical History:  Procedure Laterality Date   TYMPANOSTOMY TUBE PLACEMENT     WISDOM TOOTH EXTRACTION     Patient Active Problem List   Diagnosis Date Noted   Supervision of normal pregnancy 07/16/2022   Adjustment disorder with depressed mood 02/29/2020    PCP: [name] MD  REFERRING PROVIDER: Lesly Dukes, MD  REFERRING DIAG: O99.891,M54.9 (ICD-10-CM) - Back pain affecting pregnancy M54.50 (ICD-10-CM) - Low back pain, unspecified  ; M84.132G (ICD-10-CM) - Strain of other specified muscles, fascia and tendons at thigh level, right thigh, initial encounter Rationale for Evaluation and Treatment: Rehabilitation  THERAPY DIAG:  Other low back pain  Other muscle spasm  Abnormal posture  Muscle weakness (generalized)  ONSET DATE: 08/2022  SUBJECTIVE:                                                                                                                                                                                            SUBJECTIVE STATEMENT: Patient reports she is doing okay today. She went to the MD yesterday and everything is going well.  PERTINENT HISTORY:  Pt is [redacted] weeks pregnant; Depression  PAIN: 11/20/2022 Are you having pain? Yes: NPRS scale: 4/10 Pain location: Low back mainly right side; occasional cramps down right leg Pain description: achy; she feels like it needs to pop Aggravating factors: walking >1-hour; Figure four seated S Relieving factors: Stretching lunges; stretching adductors  PRECAUTIONS: Other: Pt is [redacted] weeks pregnant  RED FLAGS:  None   WEIGHT BEARING RESTRICTIONS: No  FALLS:  Has patient fallen in last 6 months? 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 Plans to go back to the gym after pregnancy and live a healthy lifestyle. She wants to be able to tolerate walking more outside of what she does at work.  NEXT MD VISIT: 11/10/2022   OBJECTIVE:   DIAGNOSTIC FINDINGS:  None  PATIENT SURVEYS:  Modified Oswestry 22/50 44% disability   SCREENING FOR RED FLAGS: Bowel or bladder incontinence: No Spinal tumors: No Cauda equina syndrome: No Compression fracture: No Abdominal aneurysm: No  COGNITION: Overall cognitive status: Within functional limits for tasks assessed      POSTURE: rounded shoulders, forward head, increased lumbar lordosis, and anterior pelvic tilt  PALPATION: Increased muscle spasm of Lt erector spinae; tenderness of Lt SIJ  LUMBAR ROM:   AROM eval  Flexion Bends to midshaft of  tibia  Extension 50% limited  Right lateral flexion Bends to knee joint line  Left lateral flexion Bends past knee joint line  Right rotation WFL  Left rotation WFL   (Blank rows = not  tested)  LOWER EXTREMITY ROM:   WFL   LOWER EXTREMITY MMT:    MMT Right eval Left eval  Hip flexion 4- pain 4+  Hip extension    Hip abduction 4 4  Hip adduction 4+ 4+  Hip internal rotation    Hip external rotation    Knee flexion 4- 4+  Knee extension 5 5  Ankle dorsiflexion    Ankle plantarflexion    Ankle inversion    Ankle eversion     (Blank rows = not tested)    GAIT: Comments: Waddling gait; decreased bilateral step length  TODAY'S TREATMENT:                                                                                                                              DATE:  12/10/2022 NuStep level 4 6 minutes- PT present to discuss status Standing hip flexor stretch on stair 2 x 30 each Standing hamstring stretch on stair 2 x 30 each Clamshells 2 x 10 each side with green loop Reverse clamshells 2 x 10 each with green loop Sidelying hip abduction circles 3 x 10 each direction Side lying Hip Abduction 2 x 10 Posterior pelvic tilts on SB x 20 Standing "L" at barre 2 x 30 QL stretch on stair 4 x 10 sec hold  Standing pallof press with red loop x 20 each direction Chops with red tb x 15 each direction   12/04/2022 NuStep level 3 6 minutes- PT present to discuss status Standing hip flexor stretch on stair 2 x 30 Standing hamstring stretch on  stair 2 x 30 Quadruped TA contraction x 15 Quadruped tail wag x 10 Clamshells 2 x 10 each side with green loop Reverse clamshells 2 x 10 each with green lop Posterior pelvic tilts on SB x 20 Seated hip flexion on stability ball  2 x 10 each leg Standing pallof press with red loop x 20 each direction Chops with red tb x 15 each direction Seated thoracic extension against foam roll x 10   11/26/2022 NuStep level 3 6 minutes- PT present to discuss status Standing hip flexor stretch on stair 2 x 30 Standing hamstring stretch on stair 2 x 30 Quadruped TA contraction + pelvic floor contraction x 15 Quadruped tail wag x  10 Supine butterfly + pelvic floor contraction x 10 Hooklying march with green loop x 10 each Clamshells 2 x 10 each side with green loop Reverse clamshells 2 x 10 each with green lop Standing pallof press with red loop x 15 each direction Seated thoracic extension against foam roll x 10   11/20/2022 NuStep level 3 7 minutes- PT present to discuss status Standing hip flexor stretch on stair 2 x 30 Standing hamstring stretch on stair 2 x 30 Quadruped TA contraction + pelvic floor contraction Supine butterfly + pelvic floor contraction Hooklying march with green loop x 10 each Clamshells 2 x 10 each side Reverse clamshells 2 x 10 each side Pelvic Tilts on SB     PATIENT EDUCATION:  Education details: Proper foot wear at work; lumbar mobility; seated posture Person educated: Patient Education method: Programmer, multimedia, Facilities manager, and Handouts Education comprehension: verbalized understanding, returned demonstration, and needs further education  HOME EXERCISE PROGRAM: Access Code: [email or web address] URL: [email or web address] Date: 10/29/2022 Prepared by: Claude Manges   Exercises - Seated Diaphragmatic Breathing  - 1 x daily - 7 x weekly - 1 sets - 10 reps - Quadruped Abdominal and Pelvic Brace  - 1 x daily - 7 x weekly - 1 sets - 10 reps - Tail Wag  - 1 x daily - 7 x weekly - 1 sets - 10 reps - 5-7s holds - Quadruped Cat Cow  - 1 x daily - 7 x weekly - 1 sets - 10 reps - 5-7s holds - Unilateral Supported Supine Butterfly Stretch  - 1 x daily - 7 x weekly - 1 sets - 2-3 reps - 30-45s holds - Supine Hip Internal and External Rotation  - 1 x daily - 7 x weekly - 1 sets - 2-3 reps - 30-45s holds - Seated Alternating Side Stretch with Arm Overhead  - 1 x daily - 7 x weekly - 1 sets - 2-3 reps - 30-45s holds   ASSESSMENT:  CLINICAL IMPRESSION: Today's treatment session focused on core and hip strengthening. Patient continues to show improvements with physical therapy.  Incorporated more hip stability exercises and patient verbalized feeling the muscle burn. She required rest breaks in between sets due to muscle fatgiue. Required verbal and tactile cues for correct exercise performance. Patient will benefit from skilled PT to address the below impairments and improve overall function.      OBJECTIVE IMPAIRMENTS: Abnormal gait, decreased activity tolerance, difficulty walking, decreased strength, increased muscle spasms, postural dysfunction, and pain.   ACTIVITY LIMITATIONS: sitting, sleeping, and bed mobility  PARTICIPATION LIMITATIONS: shopping, community activity, and occupation  PERSONAL FACTORS: Fitness are also affecting patient's functional outcome.   REHAB POTENTIAL: Good  CLINICAL DECISION MAKING: Stable/uncomplicated  EVALUATION COMPLEXITY: Low   GOALS: Goals reviewed with patient? Yes  SHORT  TERM GOALS: Target date: 11/25/2022  Patient will be independent with initial HEP. Baseline:  Goal status: MET 11/20/2022  2.  Patient will report > or = to 25% improvement in sleep quality. Baseline: wakes up 2-3x during the night Goal status: MET 11/20/2022 Patient reports 50% improvement in sleep quality    3. Patient will report < or = to 6/10 back pain for improved walking tolerance. Baseline: 8/10 pain Goal status: IN PROGRESS ; pain fluctuates     LONG TERM GOALS: Target date: 12/23/2022  Patient will demonstrate independence in advanced HEP. Baseline:  Goal status: INITIAL   2.  Patient's ODI will decrease to < = to 12/50 for improved function. Baseline: 22/50 Goal status: INITIAL  3.  Patient will report < or = to 4/10 back pain for improved walking tolerance. Baseline: 8/10 pain Goal status: INITIAL  4.  Patient will be able to tolerate walking for > or = to 1.5 hours for improved functional mobility. Baseline: 1 hour Goal status: INITIAL   PLAN:  PT FREQUENCY: 2x/week  PT DURATION: 8 weeks  PLANNED  INTERVENTIONS: Therapeutic exercises, Therapeutic activity, Neuromuscular re-education, Balance training, Gait training, Patient/Family education, Self Care, Joint mobilization, Joint manipulation, Vestibular training, Canalith repositioning, Aquatic Therapy, Dry Needling, Electrical stimulation, Spinal manipulation, Spinal mobilization, Cryotherapy, Moist heat, Taping, Ultrasound, Ionotophoresis 4mg /ml Dexamethasone, and Manual therapy.  PLAN FOR NEXT SESSION: continue core & hip strengthening  Claude Manges, PT 12/10/22 12:31 PM [facility] [address], [address] [place], [place] [address] Phone # [phone] Fax [phone]

## 2022-12-18 ENCOUNTER — Encounter: Payer: Self-pay | Admitting: Physical Therapy

## 2022-12-18 ENCOUNTER — Ambulatory Visit: Payer: Medicaid Other | Admitting: Physical Therapy

## 2022-12-18 DIAGNOSIS — M62838 Other muscle spasm: Secondary | ICD-10-CM

## 2022-12-18 DIAGNOSIS — M5459 Other low back pain: Secondary | ICD-10-CM

## 2022-12-18 DIAGNOSIS — R293 Abnormal posture: Secondary | ICD-10-CM

## 2022-12-18 DIAGNOSIS — M6281 Muscle weakness (generalized): Secondary | ICD-10-CM | POA: Diagnosis not present

## 2022-12-18 NOTE — Therapy (Signed)
OUTPATIENT PHYSICAL THERAPY THORACOLUMBAR TREATMENT   Patient Name: Theresa Hubbard MRN: [id number] DOB:[birth year], 22 y.o., female Today's Date: 12/18/2022  END OF SESSION:  PT End of Session - 12/18/22 1320     Visit Number 7    Date for PT Re-Evaluation 12/24/22    Authorization Type [place] Medicaid Healthy Blue    Authorization Time Period 10/29/2022-12/27/2022    Authorization - Visit Number 6    Authorization - Number of Visits 7    PT Start Time 1143    PT Stop Time 1230    PT Time Calculation (min) 47 min    Activity Tolerance Patient tolerated treatment well    Behavior During Therapy  [facility] for tasks assessed/performed                   Past Medical History:  Diagnosis Date   Acne vulgaris 12/21/2013   Broken arm    left   BV (bacterial vaginosis)    Chlamydia    Constipation 05/16/2021   Depression    Irregular menstruation 03/15/2018   Sleep disturbance 05/16/2021   UTI (urinary tract infection)    Past Surgical History:  Procedure Laterality Date   TYMPANOSTOMY TUBE PLACEMENT     WISDOM TOOTH EXTRACTION     Patient Active Problem List   Diagnosis Date Noted   Supervision of normal pregnancy 07/16/2022   Adjustment disorder with depressed mood 02/29/2020    PCP: [name] MD  REFERRING PROVIDER: Lesly Dukes, MD  REFERRING DIAG: O99.891,M54.9 (ICD-10-CM) - Back pain affecting pregnancy M54.50 (ICD-10-CM) - Low back pain, unspecified  ; O96.295M (ICD-10-CM) - Strain of other specified muscles, fascia and tendons at thigh level, right thigh, initial encounter Rationale for Evaluation and Treatment: Rehabilitation  THERAPY DIAG:  Other low back pain  Other muscle spasm  Abnormal posture  Muscle weakness (generalized)  ONSET DATE: 08/2022  SUBJECTIVE:                                                                                                                                                                                            SUBJECTIVE STATEMENT: Patient reports she thinks she has been having [employer] hicks contractions. She feels very swollen  PERTINENT HISTORY:  Pt is [redacted] weeks pregnant; Depression  PAIN: 11/20/2022 Are you having pain? Yes: NPRS scale: 4/10 Pain location: Low back mainly right side; occasional cramps down right leg Pain description: achy; she feels like it needs to pop Aggravating factors: walking >1-hour; Figure four seated S Relieving factors: Stretching lunges; stretching adductors  PRECAUTIONS: Other: Pt is [redacted] weeks pregnant  RED FLAGS: None  WEIGHT BEARING RESTRICTIONS: No  FALLS:  Has patient fallen in last 6 months? 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 Plans to go back to the gym after pregnancy and live a healthy lifestyle. She wants to be able to tolerate walking more outside of what she does at work.  NEXT MD VISIT: 11/10/2022   OBJECTIVE:   DIAGNOSTIC FINDINGS:  None  PATIENT SURVEYS:  Modified Oswestry 22/50 44% disability   SCREENING FOR RED FLAGS: Bowel or bladder incontinence: No Spinal tumors: No Cauda equina syndrome: No Compression fracture: No Abdominal aneurysm: No  COGNITION: Overall cognitive status: Within functional limits for tasks assessed      POSTURE: rounded shoulders, forward head, increased lumbar lordosis, and anterior pelvic tilt  PALPATION: Increased muscle spasm of Lt erector spinae; tenderness of Lt SIJ  LUMBAR ROM:   AROM eval  Flexion Bends to midshaft of  tibia  Extension 50% limited  Right lateral flexion Bends to knee joint line  Left lateral flexion Bends past knee joint line  Right rotation WFL  Left rotation WFL   (Blank rows = not  tested)  LOWER EXTREMITY ROM:   WFL   LOWER EXTREMITY MMT:    MMT Right eval Left eval  Hip flexion 4- pain 4+  Hip extension    Hip abduction 4 4  Hip adduction 4+ 4+  Hip internal rotation    Hip external rotation    Knee flexion 4- 4+  Knee extension 5 5  Ankle dorsiflexion    Ankle plantarflexion    Ankle inversion    Ankle eversion     (Blank rows = not tested)    GAIT: Comments: Waddling gait; decreased bilateral step length  TODAY'S TREATMENT:                                                                                                                              DATE:  12/18/2022 NuStep level 4 6 minutes- PT present to discuss status Child's Pose 3 x 45 sec Quadruped Rocking slow x 10 5 sec hold Half Kneeling Rocking x 12 bilateral  [place] Pose 1 min holds bilateral  Open Books over foam roll x 15 each direction Deep Squat holding on parallel bar 2 min hold Hip Internal Rotation stretch 2 min hold Hip internal external rotation stretch x 10 each direction Stability ball circles & rocking   12/10/2022 NuStep level 4 6 minutes- PT present to discuss status Standing hip flexor stretch on stair 2 x 30 each Standing hamstring stretch on stair 2 x 30 each Clamshells 2 x 10 each side with green loop Reverse clamshells 2 x 10 each with green loop Sidelying hip abduction circles 3 x 10  each direction Side lying Hip Abduction 2 x 10 Posterior pelvic tilts on SB x 20 Standing "L" at barre 2 x 30 QL stretch on stair 4 x 10 sec hold  Standing pallof press with red loop x 20 each direction Chops with red tb x 15 each direction   12/04/2022 NuStep level 3 6 minutes- PT present to discuss status Standing hip flexor stretch on stair 2 x 30 Standing hamstring stretch on stair 2 x 30 Quadruped TA contraction x 15 Quadruped tail wag x 10 Clamshells 2 x 10 each side with green loop Reverse clamshells 2 x 10 each with green lop Posterior pelvic tilts on SB x  20 Seated hip flexion on stability ball  2 x 10 each leg Standing pallof press with red loop x 20 each direction Chops with red tb x 15 each direction Seated thoracic extension against foam roll x 10   PATIENT EDUCATION:  Education details: Proper foot wear at work; lumbar mobility; seated posture Person educated: Patient Education method: Programmer, multimedia, Facilities manager, and Handouts Education comprehension: verbalized understanding, returned demonstration, and needs further education  HOME EXERCISE PROGRAM: Access Code: [email or web address] URL: [email or web address] Date: 10/29/2022 Prepared by: Claude Manges   Exercises - Seated Diaphragmatic Breathing  - 1 x daily - 7 x weekly - 1 sets - 10 reps - Quadruped Abdominal and Pelvic Brace  - 1 x daily - 7 x weekly - 1 sets - 10 reps - Tail Wag  - 1 x daily - 7 x weekly - 1 sets - 10 reps - 5-7s holds - Quadruped Cat Cow  - 1 x daily - 7 x weekly - 1 sets - 10 reps - 5-7s holds - Unilateral Supported Supine Butterfly Stretch  - 1 x daily - 7 x weekly - 1 sets - 2-3 reps - 30-45s holds - Supine Hip Internal and External Rotation  - 1 x daily - 7 x weekly - 1 sets - 2-3 reps - 30-45s holds - Seated Alternating Side Stretch with Arm Overhead  - 1 x daily - 7 x weekly - 1 sets - 2-3 reps - 30-45s holds   Birth Prep Exercises: [id number]  ASSESSMENT:  CLINICAL IMPRESSION: Incorporated birthing prep exercises this treatment session. Updated patient's HEP to include all exercises completed during treatment session. Patient required verbal and visual cues for correct exercise performance. Patient experienced mild discomfort with tall kneeling stretch but discomfort eased decreasing ROM. Patient has not been experiencing any more back pain within the last few weeks. Plan to discharge patient next treatment session.       OBJECTIVE IMPAIRMENTS: Abnormal gait, decreased activity tolerance, difficulty walking, decreased strength, increased muscle  spasms, postural dysfunction, and pain.   ACTIVITY LIMITATIONS: sitting, sleeping, and bed mobility  PARTICIPATION LIMITATIONS: shopping, community activity, and occupation  PERSONAL FACTORS: Fitness are also affecting patient's functional outcome.   REHAB POTENTIAL: Good  CLINICAL DECISION MAKING: Stable/uncomplicated  EVALUATION COMPLEXITY: Low   GOALS: Goals reviewed with patient? Yes  SHORT TERM GOALS: Target date: 11/25/2022  Patient will be independent with initial HEP. Baseline:  Goal status: MET 11/20/2022  2.  Patient will report > or = to 25% improvement in sleep quality. Baseline: wakes up 2-3x during the night Goal status: MET 11/20/2022 Patient reports 50% improvement in sleep quality    3. Patient will report < or = to 6/10 back pain for improved walking tolerance. Baseline: 8/10 pain Goal status: IN PROGRESS ; pain fluctuates     LONG  TERM GOALS: Target date: 12/23/2022  Patient will demonstrate independence in advanced HEP. Baseline:  Goal status: INITIAL   2.  Patient's ODI will decrease to < = to 12/50 for improved function. Baseline: 22/50 Goal status: INITIAL  3.  Patient will report < or = to 4/10 back pain for improved walking tolerance. Baseline: 8/10 pain Goal status: INITIAL  4.  Patient will be able to tolerate walking for > or = to 1.5 hours for improved functional mobility. Baseline: 1 hour Goal status: INITIAL   PLAN:  PT FREQUENCY: 2x/week  PT DURATION: 8 weeks  PLANNED INTERVENTIONS: Therapeutic exercises, Therapeutic activity, Neuromuscular re-education, Balance training, Gait training, Patient/Family education, Self Care, Joint mobilization, Joint manipulation, Vestibular training, Canalith repositioning, Aquatic Therapy, Dry Needling, Electrical stimulation, Spinal manipulation, Spinal mobilization, Cryotherapy, Moist heat, Taping, Ultrasound, Ionotophoresis 4mg /ml Dexamethasone, and Manual therapy.  PLAN FOR NEXT  SESSION: Review updated HEP; d/c patient next treatment session  Claude Manges, PT 12/18/22 1:22 PM [facility] [address] [place], [place] [address] Phone # [phone] Fax [phone]

## 2022-12-23 ENCOUNTER — Ambulatory Visit (INDEPENDENT_AMBULATORY_CARE_PROVIDER_SITE_OTHER): Payer: Medicaid Other | Admitting: Obstetrics and Gynecology

## 2022-12-23 ENCOUNTER — Other Ambulatory Visit (HOSPITAL_COMMUNITY)
Admission: RE | Admit: 2022-12-23 | Discharge: 2022-12-23 | Disposition: A | Discharge status: Home / Self Care | Payer: Medicaid Other | Source: Ambulatory Visit | Attending: Obstetrics and Gynecology | Admitting: Obstetrics and Gynecology

## 2022-12-23 VITALS — BP 114/72 | HR 93 | Wt 215.0 lb

## 2022-12-23 DIAGNOSIS — Z3400 Encounter for supervision of normal first pregnancy, unspecified trimester: Secondary | ICD-10-CM | POA: Diagnosis not present

## 2022-12-23 DIAGNOSIS — Z3A36 36 weeks gestation of pregnancy: Secondary | ICD-10-CM

## 2022-12-23 NOTE — Progress Notes (Signed)
Pt is worried about increase in swelling in ankles

## 2022-12-23 NOTE — Progress Notes (Signed)
   PRENATAL VISIT NOTE  Subjective:  Theresa Hubbard is a 22 y.o. G2P0010 at [redacted]w[redacted]d being seen today for ongoing prenatal care.  She is currently monitored for the following issues for this low-risk pregnancy and has Adjustment disorder with depressed mood and Supervision of normal pregnancy on their problem list.  Patient reports no complaints.  Contractions: Irritability. Vag. Bleeding: None.  Movement: Present. Denies leaking of fluid.   The following portions of the patient's history were reviewed and updated as appropriate: allergies, current medications, past family history, past medical history, past social history, past surgical history and problem list.   Objective:   Vitals:   12/23/22 0829  BP: 114/72  Pulse: 93  Weight: 215 lb (97.5 kg)    Fetal Status: Fetal Heart Rate (bpm): 135 Fundal Height: 36 cm Movement: Present     General:  Alert, oriented and cooperative. Patient is in no acute distress.  Skin: Skin is warm and dry. No rash noted.   Cardiovascular: Normal heart rate noted  Respiratory: Normal respiratory effort, no problems with respiration noted  Abdomen: Soft, gravid, appropriate for gestational age.  Pain/Pressure: Present     Pelvic: Cervical exam completed, Dilation: Closed Effacement (%): Thick Station: Ballotable  Extremities: Normal range of motion.  Edema: Mild pitting, slight indentation  Mental Status: Normal mood and affect. Normal behavior. Normal judgment and thought content.   Assessment and Plan:  Pregnancy: G2P0010 at [redacted]w[redacted]d 1. Supervision of normal first pregnancy, antepartum  - Reports increased swelling in hands and feet. - BP normal will continue to monitor.  - Culture, beta strep (group b only) - Cervicovaginal ancillary only( [place])  2. [redacted] weeks gestation of pregnancy  - Culture, beta strep (group b only) - Cervicovaginal ancillary only( [place])  Preterm labor symptoms and general obstetric precautions  including but not limited to vaginal bleeding, contractions, leaking of fluid and fetal movement were reviewed in detail with the patient. Please refer to After Visit Summary for other counseling recommendations.   No follow-ups on file.  Future Appointments  Date Time Provider Department Center  12/24/2022 11:45 AM Claude Manges, PT [facility] None  12/30/2022  8:30 AM Manan Olmo, Harolyn Rutherford, NP [facility]  01/06/2023  9:30 AM Adi Seales, Harolyn Rutherford, NP [facility]  01/13/2023  8:50 AM Jackston [place], Harolyn Rutherford, NP [facility]  01/20/2023  8:50 AM [name] Lorensen, Harolyn Rutherford, NP [facility]    Venia Carbon, NP

## 2022-12-24 ENCOUNTER — Ambulatory Visit: Payer: Medicaid Other | Admitting: Physical Therapy

## 2022-12-24 ENCOUNTER — Encounter: Payer: Self-pay | Admitting: Physical Therapy

## 2022-12-24 DIAGNOSIS — M5459 Other low back pain: Secondary | ICD-10-CM | POA: Diagnosis not present

## 2022-12-24 DIAGNOSIS — R293 Abnormal posture: Secondary | ICD-10-CM

## 2022-12-24 DIAGNOSIS — M6281 Muscle weakness (generalized): Secondary | ICD-10-CM

## 2022-12-24 DIAGNOSIS — M62838 Other muscle spasm: Secondary | ICD-10-CM | POA: Diagnosis not present

## 2022-12-24 LAB — CERVICOVAGINAL ANCILLARY ONLY
Chlamydia: NEGATIVE
Comment: NEGATIVE
Comment: NORMAL
Neisseria Gonorrhea: NEGATIVE

## 2022-12-24 NOTE — Therapy (Signed)
OUTPATIENT PHYSICAL THERAPY THORACOLUMBAR TREATMENT/ DISCHARGE   Patient Name: Theresa Hubbard MRN: [id number] DOB:02/19/2000, 22 y.o., female Today's Date: 12/24/2022  END OF SESSION:  PT End of Session - 12/24/22 1711     Visit Number 8    Date for PT Re-Evaluation 12/24/22    Authorization Type [place] Medicaid [employer]    Authorization Time Period 10/29/2022-12/27/2022    Authorization - Visit Number 7    Authorization - Number of Visits 7    PT Start Time 1148    PT Stop Time 1230    PT Time Calculation (min) 42 min    Activity Tolerance Patient tolerated treatment well    Behavior During Therapy [facility] for tasks assessed/performed                    Past Medical History:  Diagnosis Date   Acne vulgaris 12/21/2013   Broken arm    left   BV (bacterial vaginosis)    Chlamydia    Constipation 05/16/2021   Depression    Irregular menstruation 03/15/2018   Sleep disturbance 05/16/2021   UTI (urinary tract infection)    Past Surgical History:  Procedure Laterality Date   TYMPANOSTOMY TUBE PLACEMENT     WISDOM TOOTH EXTRACTION     Patient Active Problem List   Diagnosis Date Noted   Supervision of normal pregnancy 07/16/2022   Adjustment disorder with depressed mood 02/29/2020    PCP: [name] MD  REFERRING PROVIDER: Lesly Dukes, MD  REFERRING DIAG: O99.891,M54.9 (ICD-10-CM) - Back pain affecting pregnancy M54.50 (ICD-10-CM) - Low back pain, unspecified  ; N82.956O (ICD-10-CM) - Strain of other specified muscles, fascia and tendons at thigh level, right thigh, initial encounter Rationale for Evaluation and Treatment: Rehabilitation  THERAPY DIAG:  Other low back pain  Other muscle spasm  Abnormal posture  Muscle weakness (generalized)  ONSET DATE: 08/2022  SUBJECTIVE:                                                                                                                                                                                            SUBJECTIVE STATEMENT: Patient reports she is doing good today. She has been having increased foot swelling recently.  PERTINENT HISTORY:  Pt is [redacted] weeks pregnant; Depression  PAIN: 12/24/2022 Are you having pain? Yes: NPRS scale: 0/10 Pain location: Low back mainly right side; occasional cramps down right leg Pain description: achy; she feels like it needs to pop Aggravating factors: walking >1-hour; Figure four seated S Relieving factors: Stretching lunges; stretching adductors  PRECAUTIONS: Other: Pt is [redacted] weeks pregnant  RED FLAGS:  None   WEIGHT BEARING RESTRICTIONS: No  FALLS:  Has patient fallen in last 6 months? 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 Plans to go back to the gym after pregnancy and live a healthy lifestyle. She wants to be able to tolerate walking more outside of what she does at work.  NEXT MD VISIT: 11/10/2022   OBJECTIVE:   DIAGNOSTIC FINDINGS:  None  PATIENT SURVEYS:  Modified Oswestry 22/50 44% disability  12/24/2022 : ODI:14/50 28%  SCREENING FOR RED FLAGS: Bowel or bladder incontinence: No Spinal tumors: No Cauda equina syndrome: No Compression fracture: No Abdominal aneurysm: No  COGNITION: Overall cognitive status: Within functional limits for tasks assessed      POSTURE: rounded shoulders, forward head, increased lumbar lordosis, and anterior pelvic tilt  PALPATION: Increased muscle spasm of Lt erector spinae; tenderness of Lt SIJ  LUMBAR ROM:   AROM eval  Flexion Bends to midshaft of  tibia  Extension 50% limited  Right lateral flexion Bends to knee joint line  Left lateral flexion Bends past knee joint line  Right rotation WFL   Left rotation WFL   (Blank rows = not tested)  LOWER EXTREMITY ROM:   WFL   LOWER EXTREMITY MMT:    MMT Right eval Left eval  Hip flexion 4- pain 4+  Hip extension    Hip abduction 4 4  Hip adduction 4+ 4+  Hip internal rotation    Hip external rotation    Knee flexion 4- 4+  Knee extension 5 5  Ankle dorsiflexion    Ankle plantarflexion    Ankle inversion    Ankle eversion     (Blank rows = not tested)    GAIT: Comments: Waddling gait; decreased bilateral step length  TODAY'S TREATMENT:                                                                                                                              DATE:  12/24/2022 NuStep level 5 6 minutes- PT present to discuss status ODI:14/50 28% Standing "Lt" at parallel bar x 10 5 sec holds Child's Pose 3 x 45 sec Quadruped Rocking slow x 10 5 sec hold Seated Butterfly stretch 3 x 45 sec Seated Butterfly stretch with QL stretch 4 x 10 sec hold Pigeon Pose 1 min holds bilateral  Deep Squat holding on parallel bar 2 min hold Hip internal external rotation stretch x 10 each direction Supine with foam roll under lumbar spine with hip internal/ external rotation x 10 each   12/18/2022 NuStep level 4 6 minutes- PT present to discuss status Child's Pose 3 x 45 sec Quadruped Rocking slow x 10 5 sec hold Half Kneeling Rocking x 12 bilateral  Pigeon Pose 1 min holds bilateral  Open Books over foam roll x 15 each direction Deep Squat holding on parallel bar 2 min hold Hip Internal Rotation stretch 2 min hold Hip internal external rotation stretch x 10 each direction Stability ball circles & rocking   12/10/2022 NuStep level 4 6 minutes- PT present to discuss status Standing hip flexor stretch on stair 2 x 30 each Standing hamstring stretch on stair 2 x 30 each Clamshells 2 x 10 each side with green loop Reverse clamshells 2 x 10 each with green loop Sidelying hip abduction circles 3 x 10 each direction Side  lying Hip Abduction 2 x 10 Posterior pelvic tilts on SB x 20 Standing "L" at barre 2 x 30 QL stretch on stair 4 x 10 sec hold  Standing pallof press with red loop x 20 each direction Chops with red tb x 15 each direction    PATIENT EDUCATION:  Education details: Proper foot wear at work; lumbar mobility; seated posture Person educated: Patient Education method: Programmer, multimedia, Facilities manager, and Handouts Education comprehension: verbalized understanding, returned demonstration, and needs further education  HOME EXERCISE PROGRAM: Access Code: [email or web address] URL: [email or web address] Date: 10/29/2022 Prepared by: Claude Manges   Exercises - Seated Diaphragmatic Breathing  - 1 x daily - 7 x weekly - 1 sets - 10 reps - Quadruped Abdominal and Pelvic Brace  - 1 x daily - 7 x weekly - 1 sets - 10 reps - Tail Wag  - 1 x daily - 7 x weekly - 1 sets - 10 reps - 5-7s holds - Quadruped Cat Cow  - 1 x daily - 7 x weekly - 1 sets - 10 reps - 5-7s holds - Unilateral Supported Supine Butterfly Stretch  - 1 x daily - 7 x weekly - 1 sets - 2-3 reps - 30-45s holds - Supine Hip Internal and External Rotation  - 1 x daily - 7 x weekly - 1 sets - 2-3 reps - 30-45s holds - Seated Alternating Side Stretch with Arm Overhead  - 1 x daily - 7 x weekly - 1 sets - 2-3 reps - 30-45s holds   Birth Prep Exercises: [id number]  ASSESSMENT:  CLINICAL IMPRESSION: Diani has made great improvements with physical therapy. She has met majority of her goals at this time. Updated patient's HEP to include birth prep exercises. She still has limitations with her standing tolerance due to he increased foot swelling. Educated patient on the benefit of pelvic/ ortho PT after birth. Patient plans to come back for physical therapy once she as her daughter. Patient to discharge home with HEP.     OBJECTIVE IMPAIRMENTS: Abnormal gait, decreased activity tolerance, difficulty walking, decreased strength, increased  muscle spasms, postural dysfunction, and pain.   ACTIVITY LIMITATIONS: sitting, sleeping, and bed mobility  PARTICIPATION LIMITATIONS: shopping, community activity, and occupation  PERSONAL FACTORS: Fitness are also affecting patient's functional outcome.   REHAB POTENTIAL: Good  CLINICAL DECISION MAKING: Stable/uncomplicated  EVALUATION COMPLEXITY: Low   GOALS: Goals reviewed with patient? Yes  SHORT TERM GOALS: Target date: 11/25/2022  Patient will be independent with initial HEP. Baseline:  Goal status: MET 11/20/2022  2.  Patient will report > or = to 25% improvement in sleep quality. Baseline: wakes up 2-3x during the night Goal status: MET 11/20/2022 Patient reports 50% improvement in sleep quality    3. Patient will report < or = to 6/10 back pain for improved walking tolerance. Baseline: 8/10 pain Goal status: MET 12/24/2022  LONG TERM GOALS: Target date: 12/23/2022  Patient will demonstrate independence in advanced HEP. Baseline:  Goal status: MET 12/24/2022   2.  Patient's ODI will decrease to < = to 12/50 for improved function. Baseline: 22/50 Goal status: NOT MET 14/50 12/24/2022  3.  Patient will report < or = to 4/10 back pain for improved walking tolerance. Baseline: 8/10 pain Goal status: MET 12/24/2022  4.  Patient will be able to tolerate walking for > or = to 1.5 hours for improved functional mobility. Baseline: 1 hour Goal status: NOT MET 12/24/2022 1 hour is her max tolerance due to increase foot pain   PLAN:  PT FREQUENCY: 2x/week  PT DURATION: 8 weeks  PLANNED INTERVENTIONS: Therapeutic exercises, Therapeutic activity, Neuromuscular re-education, Balance training, Gait training, Patient/Family education, Self Care, Joint mobilization, Joint manipulation, Vestibular training, Canalith repositioning, Aquatic Therapy, Dry Needling, Electrical stimulation, Spinal manipulation, Spinal mobilization, Cryotherapy, Moist heat, Taping,  Ultrasound, Ionotophoresis 4mg /ml Dexamethasone, and Manual therapy.  PLAN FOR NEXT SESSION: Patient to d/c home with HEP  PHYSICAL THERAPY DISCHARGE SUMMARY  Visits from Start of Care: 7  Current functional level related to goals / functional outcomes: Dalia has made great improvements with physical therapy. She has met majority of her goals at this time.    Remaining deficits: See above   Education / Equipment: See HEP above   Patient agrees to discharge. Patient goals were partially met. Patient is being discharged due to being pleased with the current functional level.   Claude Manges, PT 12/24/22 5:11 PM [facility] [address], [address] [place], [place] [address] Phone # [phone] Fax [phone]

## 2022-12-27 LAB — CULTURE, BETA STREP (GROUP B ONLY): Strep Gp B Culture: NEGATIVE

## 2022-12-30 ENCOUNTER — Ambulatory Visit (INDEPENDENT_AMBULATORY_CARE_PROVIDER_SITE_OTHER): Payer: Medicaid Other | Admitting: Advanced Practice Midwife

## 2022-12-30 VITALS — BP 132/75 | HR 103 | Wt 219.0 lb

## 2022-12-30 DIAGNOSIS — Z3A37 37 weeks gestation of pregnancy: Secondary | ICD-10-CM

## 2022-12-30 DIAGNOSIS — Z3403 Encounter for supervision of normal first pregnancy, third trimester: Secondary | ICD-10-CM

## 2022-12-30 NOTE — Progress Notes (Signed)
Pt has been having headaches.

## 2022-12-30 NOTE — Progress Notes (Signed)
   PRENATAL VISIT NOTE  Subjective:  Theresa Hubbard is a 22 y.o. G2P0010 at [redacted]w[redacted]d being seen today for ongoing prenatal care.  She is currently monitored for the following issues for this low-risk pregnancy and has Adjustment disorder with depressed mood and Supervision of normal pregnancy on their problem list.  Patient reports headache, occasional contractions, and swelling .  Contractions: Irritability. Vag. Bleeding: None.  Movement: Present. Denies leaking of fluid.   The following portions of the patient's history were reviewed and updated as appropriate: allergies, current medications, past family history, past medical history, past social history, past surgical history and problem list.   Objective:   Vitals:   12/30/22 0838  BP: 132/75  Pulse: (!) 103  Weight: 219 lb (99.3 kg)    Fetal Status: Fetal Heart Rate (bpm): 146   Movement: Present     General:  Alert, oriented and cooperative. Patient is in no acute distress.  Skin: Skin is warm and dry. No rash noted.   Cardiovascular: Normal heart rate noted  Respiratory: Normal respiratory effort, no problems with respiration noted  Abdomen: Soft, gravid, appropriate for gestational age.  Pain/Pressure: Present     Pelvic: Cervical exam deferred        Extremities: Normal range of motion.  Edema: Mild pitting, slight indentation  Mental Status: Normal mood and affect. Normal behavior. Normal judgment and thought content.   Assessment and Plan:  Pregnancy: G2P0010 at [redacted]w[redacted]d 1. Encounter for supervision of normal first pregnancy in third trimester - watch BP - Rec home cuff  2. [redacted] weeks gestation of pregnancy - GBS neg  Term labor symptoms and general obstetric precautions including but not limited to vaginal bleeding, contractions, leaking of fluid and fetal movement were reviewed in detail with the patient. Please refer to After Visit Summary for other counseling recommendations.   No follow-ups on  file.  Future Appointments  Date Time Provider Department Center  01/06/2023  9:30 AM Rasch, Harolyn Rutherford, NP [facility]  01/13/2023  8:50 AM Rasch, Harolyn Rutherford, NP [facility]  01/20/2023  8:50 AM Rasch, Harolyn Rutherford, NP [facility]    Dorathy Kinsman, CNM

## 2022-12-30 NOTE — Patient Instructions (Addendum)
[email or web address]   Things to Try After 37 weeks to Encourage Labor/Get Ready for Labor:    Try the [employer] at [email or web address] daily to improve baby's position and encourage the onset of labor.  Walk a little and rest a little every day.  Change positions often.  Cervical Ripening: May try one or both Red Raspberry Leaf capsules or tea:  two 300mg  or 400mg  tablets with each meal, 2-3 times a day, or 1-3 cups of tea daily  Potential Side Effects Of Raspberry Leaf:  Most women do not experience any side effects from drinking raspberry leaf tea. However, nausea and loose stools are possible   Evening Primrose Oil capsules: take 1 capsule by mouth and place one capsule in the vagina every night.    Some of the potential side effects:  Upset stomach  Loose stools or diarrhea  Headaches  Nausea  Sex can also help the cervix ripen and encourage labor onset.  Respiratory Syncytial Virus (RSV) Vaccine VIS  Why get vaccinated? RSV vaccine can prevent lower respiratory tract disease caused by respiratory syncytial virus (RSV). RSV is a common respiratory virus that usually causes mild, cold-like symptoms.  RSV can cause illness in people of all ages but may be especially serious for infants and older adults.  Infants up to 42 months of age (especially those 6 months and younger) and children who were born prematurely, or who have chronic lung or heart disease or a weakened immune system, are at increased risk of severe RSV disease. Adults at highest risk for severe RSV disease include older adults, adults with chronic medical conditions such as heart or lung disease, weakened immune systems, or certain other underlying medical conditions, or who live in nursing homes or long-term care facilities. RSV spreads through direct contact with the virus, such as droplets from another person's cough or sneeze contacting your eyes, nose, or mouth. It can also be spread by touching a  surface that has the virus on it, like a doorknob, and then touching your face before washing your hands.  Symptoms of RSV infection may include runny nose, decrease in appetite, coughing, sneezing, fever, or wheezing. In very young infants, symptoms of RSV may also include irritability (fussiness), decreased activity, or apnea (pauses in breathing for more than 10 seconds).  Most people recover in a week or two, but RSV can be serious, resulting in shortness of breath and low oxygen levels. RSV can cause bronchiolitis (inflammation of the small airways in the lung) and pneumonia (infection of the lungs). RSV can sometimes lead to worsening of other medical conditions such as asthma, chronic obstructive pulmonary disease (a chronic disease of the lungs that makes it hard to breathe), or congestive heart failure (when the heart can't pump enough blood and oxygen throughout the body).  Older adults and infants who get very sick from RSV may need to be hospitalized. Some may even die.  RSV vaccine CDC recommends adults 88 years of age and older have the option to receive a single dose of RSV vaccine, based on discussions between the patient and their health care provider.  There are two options for protection of infants against RSV: maternal vaccine for the pregnant person and preventive antibodies given to the baby. Only one of these options is needed for most babies to be protected. CDC recommends a single dose of RSV vaccine for pregnant people from week 32 through week 36 of pregnancy for the prevention of RSV disease in infants under 6  months of age. This vaccine is recommended to be given from September through January for most of the [place].  However, in some locations (the territories, [place], [place], and parts of [place]), the timing of vaccination may vary as RSV circulating in these locations differs from the timing of the RSV season in the rest of the [place]   RSV vaccine may be given at  the same time as other vaccines.  Talk with your health care provider Tell your vaccination provider if the person getting the vaccine:  Has had an allergic reaction after a previous dose of RSV vaccine, or has any severe, life-threatening allergies In some cases, your health care provider may decide to postpone RSV vaccination until a future visit.  People with minor illnesses, such as a cold, may be vaccinated. People who are moderately or severely ill should usually wait until they recover before getting RSV vaccine.  Your health care provider can give you more information.  Risks of a vaccine reaction Pain, redness, and swelling where the shot is given, fatigue (feeling tired), fever, headache, nausea, diarrhea, and muscle or joint pain can happen after RSV vaccination. Serious neurologic conditions, including Guillain-Barr syndrome (GBS), have been reported after RSV vaccination in clinical trials of older adults. It is unclear whether the vaccine caused these events.  Preterm birth and high blood pressure during pregnancy, including pre-eclampsia, have been reported among pregnant people who received RSV vaccine during clinical trials. It is unclear whether these events were caused by the vaccine.  People sometimes faint after medical procedures, including vaccination. Tell your provider if you feel dizzy or have vision changes or ringing in the ears.  As with any medicine, there is a very remote chance of a vaccine causing a severe allergic reaction, other serious injury, or death.  What if there is a serious problem? An allergic reaction could occur after the vaccinated person leaves the clinic. If you see signs of a severe allergic reaction (hives, swelling of the face and throat, difficulty breathing, a fast heartbeat, dizziness, or weakness), call 9-1-1 and get the person to the nearest hospital.  For other signs that concern you, call your health care provider.  Adverse  reactions should be reported to the Vaccine Adverse Event Reporting System (VAERS). Your health care provider will usually file this report, or you can do it yourself. Visit the VAERS website or call 616-048-1573. VAERS is only for reporting reactions, and VAERS staff members do not give medical advice.  How can I learn more? Ask your health care provider. Call your local or state health department. Visit the website of the Food and Drug Administration (FDA) for vaccine package inserts and additional information. Contact the Centers for Disease Control and Prevention (CDC): Call 979-880-0288 (1-800-CDC-INFO) or Visit CDC's vaccine website

## 2023-01-06 ENCOUNTER — Ambulatory Visit (INDEPENDENT_AMBULATORY_CARE_PROVIDER_SITE_OTHER): Payer: Medicaid Other | Admitting: Obstetrics and Gynecology

## 2023-01-06 VITALS — BP 120/77 | HR 98 | Wt 223.0 lb

## 2023-01-06 DIAGNOSIS — O99343 Other mental disorders complicating pregnancy, third trimester: Secondary | ICD-10-CM

## 2023-01-06 DIAGNOSIS — F4321 Adjustment disorder with depressed mood: Secondary | ICD-10-CM

## 2023-01-06 DIAGNOSIS — Z3A38 38 weeks gestation of pregnancy: Secondary | ICD-10-CM

## 2023-01-06 DIAGNOSIS — Z3403 Encounter for supervision of normal first pregnancy, third trimester: Secondary | ICD-10-CM

## 2023-01-06 NOTE — Progress Notes (Signed)
   PRENATAL VISIT NOTE  Subjective:  Theresa Hubbard is a 22 y.o. G2P0010 at [redacted]w[redacted]d being seen today for ongoing prenatal care.  She is currently monitored for the following issues for this low-risk pregnancy and has Adjustment disorder with depressed mood and Supervision of normal pregnancy on their problem list.  Patient reports contractions since irregular .  Contractions: Not present. Vag. Bleeding: None.  Movement: Present. Denies leaking of fluid.   The following portions of the patient's history were reviewed and updated as appropriate: allergies, current medications, past family history, past medical history, past social history, past surgical history and problem list.   Objective:   Vitals:   01/06/23 0939  BP: 120/77  Pulse: 98  Weight: 101.2 kg    Fetal Status: Fetal Heart Rate (bpm): 138 Fundal Height: 39 cm Movement: Present     General:  Alert, oriented and cooperative. Patient is in no acute distress.  Skin: Skin is warm and dry. No rash noted.   Cardiovascular: Normal heart rate noted  Respiratory: Normal respiratory effort, no problems with respiration noted  Abdomen: Soft, gravid, appropriate for gestational age.  Pain/Pressure: Absent     Pelvic: Cervix: 1 cm, thick, posterior.   Extremities: Normal range of motion.  Edema: Mild pitting, slight indentation  Mental Status: Normal mood and affect. Normal behavior. Normal judgment and thought content.   Assessment and Plan:  Pregnancy: G2P0010 at [redacted]w[redacted]d  1. Encounter for supervision of normal first pregnancy in third trimester  Will consider cervical strip next week Would like induction at 40 weeks. BP normal.    Preterm labor symptoms and general obstetric precautions including but not limited to vaginal bleeding, contractions, leaking of fluid and fetal movement were reviewed in detail with the patient. Please refer to After Visit Summary for other counseling recommendations.   No follow-ups on  file.  Future Appointments  Date Time Provider Department Center  01/13/2023  8:50 AM Clayden Withem, Harolyn Rutherford, NP [facility]  01/20/2023  8:50 AM Marque Bango, Harolyn Rutherford, NP [facility]    Venia Carbon, NP

## 2023-01-13 ENCOUNTER — Ambulatory Visit (INDEPENDENT_AMBULATORY_CARE_PROVIDER_SITE_OTHER): Payer: Medicaid Other | Admitting: Obstetrics and Gynecology

## 2023-01-13 VITALS — BP 128/76 | HR 96 | Wt 232.0 lb

## 2023-01-13 DIAGNOSIS — Z3483 Encounter for supervision of other normal pregnancy, third trimester: Secondary | ICD-10-CM

## 2023-01-13 DIAGNOSIS — Z3403 Encounter for supervision of normal first pregnancy, third trimester: Secondary | ICD-10-CM

## 2023-01-13 NOTE — Patient Instructions (Signed)

## 2023-01-13 NOTE — Progress Notes (Signed)
   PRENATAL VISIT NOTE  Subjective:  Theresa Hubbard is a 22 y.o. G2P0010 at [redacted]w[redacted]d being seen today for ongoing prenatal care.  She is currently monitored for the following issues for this low-risk pregnancy and has Adjustment disorder with depressed mood and Supervision of normal pregnancy on their problem list.  Patient reports  swelling, 3+ in lower extremities .  Contractions: Irritability. Vag. Bleeding: None.  Movement: Present. Denies leaking of fluid.   The following portions of the patient's history were reviewed and updated as appropriate: allergies, current medications, past family history, past medical history, past social history, past surgical history and problem list.   Objective:   Vitals:   01/13/23 0902  BP: 128/76  Pulse: 96  Weight: 232 lb (105.2 kg)    Fetal Status: Fetal Heart Rate (bpm): 141 Fundal Height: 40 cm Movement: Present     General:  Alert, oriented and cooperative. Patient is in no acute distress.  Skin: Skin is warm and dry. No rash noted.   Cardiovascular: Normal heart rate noted  Respiratory: Normal respiratory effort, no problems with respiration noted  Abdomen: Soft, gravid, appropriate for gestational age.  Pain/Pressure: Absent     Pelvic: Cervical exam performed in the presence of a chaperone Dilation: Closed Effacement (%): Thick Station: -3  Extremities: Normal range of motion.  Edema: Mild pitting, slight indentation  Mental Status: Normal mood and affect. Normal behavior. Normal judgment and thought content.   Assessment and Plan:  Pregnancy: G2P0010 at [redacted]w[redacted]d  1. Encounter for supervision of normal first pregnancy in third trimester  Would like to schedule induction at 40 weeks.  Swelling in lower extremities 3+ and painful. BP normal today.    Term labor symptoms and general obstetric precautions including but not limited to vaginal bleeding, contractions, leaking of fluid and fetal movement were reviewed in detail with  the patient. Please refer to After Visit Summary for other counseling recommendations.   No follow-ups on file.  Future Appointments  Date Time Provider Department Center  01/20/2023  8:50 AM Conrado Nance, Harolyn Rutherford, NP [facility]    Venia Carbon, NP

## 2023-01-15 ENCOUNTER — Inpatient Hospital Stay (HOSPITAL_COMMUNITY): Payer: Medicaid Other | Admitting: Anesthesiology

## 2023-01-15 ENCOUNTER — Inpatient Hospital Stay (HOSPITAL_COMMUNITY)
Admission: AD | Admit: 2023-01-15 | Discharge: 2023-01-18 | DRG: 805 | Disposition: A | Discharge status: Home / Self Care | Payer: Medicaid Other | Attending: Obstetrics and Gynecology | Admitting: Obstetrics and Gynecology

## 2023-01-15 ENCOUNTER — Other Ambulatory Visit: Payer: Self-pay

## 2023-01-15 ENCOUNTER — Encounter (HOSPITAL_COMMUNITY): Payer: Self-pay | Admitting: Obstetrics and Gynecology

## 2023-01-15 DIAGNOSIS — O864 Pyrexia of unknown origin following delivery: Secondary | ICD-10-CM | POA: Diagnosis not present

## 2023-01-15 DIAGNOSIS — O165 Unspecified maternal hypertension, complicating the puerperium: Secondary | ICD-10-CM | POA: Diagnosis not present

## 2023-01-15 DIAGNOSIS — Z833 Family history of diabetes mellitus: Secondary | ICD-10-CM

## 2023-01-15 DIAGNOSIS — Z79899 Other long term (current) drug therapy: Secondary | ICD-10-CM

## 2023-01-15 DIAGNOSIS — O4202 Full-term premature rupture of membranes, onset of labor within 24 hours of rupture: Secondary | ICD-10-CM | POA: Diagnosis not present

## 2023-01-15 DIAGNOSIS — Z3403 Encounter for supervision of normal first pregnancy, third trimester: Secondary | ICD-10-CM

## 2023-01-15 DIAGNOSIS — Z3A39 39 weeks gestation of pregnancy: Secondary | ICD-10-CM

## 2023-01-15 DIAGNOSIS — O411230 Chorioamnionitis, third trimester, not applicable or unspecified: Secondary | ICD-10-CM | POA: Diagnosis present

## 2023-01-15 DIAGNOSIS — O99214 Obesity complicating childbirth: Principal | ICD-10-CM | POA: Diagnosis present

## 2023-01-15 DIAGNOSIS — D62 Acute posthemorrhagic anemia: Secondary | ICD-10-CM | POA: Diagnosis not present

## 2023-01-15 DIAGNOSIS — O135 Gestational [pregnancy-induced] hypertension without significant proteinuria, complicating the puerperium: Secondary | ICD-10-CM | POA: Diagnosis not present

## 2023-01-15 DIAGNOSIS — O26893 Other specified pregnancy related conditions, third trimester: Secondary | ICD-10-CM | POA: Diagnosis not present

## 2023-01-15 DIAGNOSIS — O480 Post-term pregnancy: Principal | ICD-10-CM | POA: Diagnosis present

## 2023-01-15 LAB — CBC
HCT: 32.8 % — ABNORMAL LOW (ref 36.0–46.0)
Hemoglobin: 10.6 g/dL — ABNORMAL LOW (ref 12.0–15.0)
MCH: 29.1 pg (ref 26.0–34.0)
MCHC: 32.3 g/dL (ref 30.0–36.0)
MCV: 90.1 fL (ref 80.0–100.0)
Platelets: 214 10*3/uL (ref 150–400)
RBC: 3.64 MIL/uL — ABNORMAL LOW (ref 3.87–5.11)
RDW: 13.6 % (ref 11.5–15.5)
WBC: 9.1 10*3/uL (ref 4.0–10.5)
nRBC: 0 % (ref 0.0–0.2)

## 2023-01-15 LAB — TYPE AND SCREEN
ABO/RH(D): O POS
Antibody Screen: NEGATIVE

## 2023-01-15 LAB — POCT FERN TEST: POCT Fern Test: POSITIVE

## 2023-01-15 MED ORDER — LIDOCAINE HCL (PF) 1 % IJ SOLN: 30.0000 mL | INTRAMUSCULAR | Status: DC | PRN

## 2023-01-15 MED ORDER — PHENYLEPHRINE 80 MCG/ML (10ML) SYRINGE FOR IV PUSH (FOR BLOOD PRESSURE SUPPORT)
80.0000 ug | PREFILLED_SYRINGE | INTRAVENOUS | Status: DC | PRN
Start: 2023-01-15 — End: 2023-01-16

## 2023-01-15 MED ORDER — LACTATED RINGERS IV SOLN
500.0000 mL | INTRAVENOUS | Status: DC | PRN
  Administered 2023-01-15: 500 mL via INTRAVENOUS

## 2023-01-15 MED ORDER — ONDANSETRON HCL 4 MG/2ML IJ SOLN: 4.0000 mg | Freq: Four times a day (QID) | INTRAMUSCULAR | Status: DC | PRN

## 2023-01-15 MED ORDER — ACETAMINOPHEN 325 MG PO TABS: 650.0000 mg | ORAL_TABLET | ORAL | Status: DC | PRN

## 2023-01-15 MED ORDER — EPHEDRINE 5 MG/ML INJ
10.0000 mg | INTRAVENOUS | Status: DC | PRN
Start: 2023-01-15 — End: 2023-01-16

## 2023-01-15 MED ORDER — OXYTOCIN-SODIUM CHLORIDE 30-0.9 UT/500ML-% IV SOLN
2.5000 [IU]/h | INTRAVENOUS | Status: DC
  Administered 2023-01-16: 2.5 [IU]/h via INTRAVENOUS
  Filled 2023-01-15 (×2): qty 500

## 2023-01-15 MED ORDER — SODIUM BICARBONATE 8.4 % IV SOLN
INTRAVENOUS | Status: DC | PRN
  Administered 2023-01-15: 2 mL via EPIDURAL
  Administered 2023-01-15: 8 mL via EPIDURAL

## 2023-01-15 MED ORDER — OXYTOCIN-SODIUM CHLORIDE 30-0.9 UT/500ML-% IV SOLN
1.0000 m[IU]/min | INTRAVENOUS | Status: DC
  Administered 2023-01-15: 8 m[IU]/min via INTRAVENOUS
  Administered 2023-01-15: 6 m[IU]/min via INTRAVENOUS
  Administered 2023-01-15: 2 m[IU]/min via INTRAVENOUS
  Administered 2023-01-15: 4 m[IU]/min via INTRAVENOUS
  Administered 2023-01-16: 6 m[IU]/min via INTRAVENOUS
  Administered 2023-01-16: 4 m[IU]/min via INTRAVENOUS
  Administered 2023-01-16: 2 m[IU]/min via INTRAVENOUS

## 2023-01-15 MED ORDER — OXYCODONE-ACETAMINOPHEN 5-325 MG PO TABS: 1.0000 | ORAL_TABLET | ORAL | Status: DC | PRN

## 2023-01-15 MED ORDER — OXYCODONE-ACETAMINOPHEN 5-325 MG PO TABS: 2.0000 | ORAL_TABLET | ORAL | Status: DC | PRN

## 2023-01-15 MED ORDER — FENTANYL-BUPIVACAINE-NACL 0.5-0.125-0.9 MG/250ML-% EP SOLN
EPIDURAL | Status: DC | PRN
  Administered 2023-01-15: 12 mL/h via EPIDURAL

## 2023-01-15 MED ORDER — SOD CITRATE-CITRIC ACID 500-334 MG/5ML PO SOLN: 30.0000 mL | ORAL | Status: DC | PRN

## 2023-01-15 MED ORDER — EPHEDRINE 5 MG/ML INJ: 10.0000 mg | INTRAVENOUS | Status: DC | PRN

## 2023-01-15 MED ORDER — PHENYLEPHRINE 80 MCG/ML (10ML) SYRINGE FOR IV PUSH (FOR BLOOD PRESSURE SUPPORT): 80.0000 ug | PREFILLED_SYRINGE | INTRAVENOUS | Status: DC | PRN

## 2023-01-15 MED ORDER — LACTATED RINGERS IV SOLN
500.0000 mL | Freq: Once | INTRAVENOUS | Status: AC
Start: 2023-01-15 — End: 2023-01-15
  Administered 2023-01-15: 500 mL via INTRAVENOUS

## 2023-01-15 MED ORDER — TERBUTALINE SULFATE 1 MG/ML IJ SOLN: 0.2500 mg | Freq: Once | INTRAMUSCULAR | Status: DC | PRN

## 2023-01-15 MED ORDER — DIPHENHYDRAMINE HCL 50 MG/ML IJ SOLN
12.5000 mg | INTRAMUSCULAR | Status: DC | PRN
Start: 2023-01-15 — End: 2023-01-16

## 2023-01-15 MED ORDER — FENTANYL-BUPIVACAINE-NACL 0.5-0.125-0.9 MG/250ML-% EP SOLN
12.0000 mL/h | EPIDURAL | Status: DC | PRN
  Administered 2023-01-16: 12 mL/h via EPIDURAL
  Filled 2023-01-15 (×3): qty 250

## 2023-01-15 MED ORDER — LACTATED RINGERS IV SOLN: INTRAVENOUS | Status: DC

## 2023-01-15 MED ORDER — OXYTOCIN BOLUS FROM INFUSION
333.0000 mL | Freq: Once | INTRAVENOUS | Status: AC
  Administered 2023-01-16: 333 mL via INTRAVENOUS

## 2023-01-15 NOTE — Progress Notes (Signed)
Patient ID: Theresa Hubbard, female   DOB: Jun 30, 2000, 22 y.o.   MRN: [id number] Pitocin was started at about 8pm  Comfortable  Vitals:   01/15/23 1830 01/15/23 1900 01/15/23 1931 01/15/23 2001  BP: 132/66 131/83 134/87 130/71  Pulse: 88 78 97 79  Resp:   14 14  Temp:   98.6 F (37 C)   TempSrc:   Oral   SpO2:      Weight:      Height:        FHR baseline 130s, average variability + accelerations Occasional small variable decels  UCs becoming more regular  Cervix exam deferred Last exam:  Dilation: 3 Effacement (%): 90 Cervical Position: Posterior Station: -3 Presentation: Vertex Exam by:: Lucianne Muss, MD   Will continue to observe

## 2023-01-15 NOTE — Progress Notes (Signed)
Patient ID: Theresa Hubbard, female   DOB: Mar 01, 2000, 22 y.o.   MRN: [id number] Vitals:   01/15/23 2031 01/15/23 2101 01/15/23 2131 01/15/23 2153  BP: 136/75 114/66 120/69   Pulse: 79 76 92   Resp: 14 14 14    Temp:    98.3 F (36.8 C)  TempSrc:    Oral  SpO2:      Weight:      Height:       FHR stable UCs every 2-3 min  Dilation: 7 Effacement (%): 80 Cervical Position: Posterior Station: 0 Presentation: Vertex Exam by:: Duffy Rhody, RN  Will anticipate SVD

## 2023-01-15 NOTE — H&P (Addendum)
OBSTETRIC ADMISSION HISTORY AND PHYSICAL  Theresa Hubbard is a 22 y.o. female G2P0010 with IUP at [redacted]w[redacted]d by 18wk [place] presenting for SROM. She reports +FMs, No LOF, no VB, no blurry vision, headaches or peripheral edema, and RUQ pain.  She plans on breast feeding. She remains undecided regarding birth control.  She received her prenatal care at  [facility]    Dating: By 18wk [place] --->  Estimated Date of Delivery: 01/18/23  Sono:    @[redacted]w[redacted]d , CWD, normal anatomy, cephalic presentation, 586g, 75% EFW   Prenatal History/Complications: Uncomplicated pregnancy. History of adjustment disorder and depressed mood.   Past Medical History: Past Medical History:  Diagnosis Date   Acne vulgaris 12/21/2013   Broken arm    left   BV (bacterial vaginosis)    Chlamydia    Constipation 05/16/2021   Depression    Irregular menstruation 03/15/2018   Sleep disturbance 05/16/2021   UTI (urinary tract infection)     Past Surgical History: Past Surgical History:  Procedure Laterality Date   TYMPANOSTOMY TUBE PLACEMENT     WISDOM TOOTH EXTRACTION      Obstetrical History: OB History     Gravida  2   Para      Term      Preterm      AB  1   Living         SAB  1   IAB      Ectopic      Multiple      Live Births              Social History Social History   Socioeconomic History   Marital status: Single    Spouse name: Not on file   Number of children: Not on file   Years of education: Not on file   Highest education level: Not on file  Occupational History   Not on file  Tobacco Use   Smoking status: Never   Smokeless tobacco: Never  Vaping Use   Vaping status: Never Used  Substance and Sexual Activity   Alcohol use: Not Currently    Comment: only when traveling abroad   Drug use: Never   Sexual activity: Yes    Birth control/protection: None    Comment: 02-18-18  Other Topics Concern   Not on file  Social History Narrative   Not on file    Social Drivers of Health   Financial Resource Strain: Not on file  Food Insecurity: No Food Insecurity (03/19/2018)   Hunger Vital Sign    Worried About Running Out of Food in the Last Year: Never true    Ran Out of Food in the Last Year: Never true  Transportation Needs: Not on file  Physical Activity: Not on file  Stress: Not on file  Social Connections: Not on file    Family History: Family History  Problem Relation Age of Onset   Diabetes Mother    Asthma Sister     Allergies: No Known Allergies  Medications Prior to Admission  Medication Sig Dispense Refill Last Dose/Taking   Prenatal Vit-Fe Fumarate-FA (MULTIVITAMIN-PRENATAL) 27-0.8 MG TABS tablet Take 1 tablet by mouth daily at 12 noon.   01/14/2023     Review of Systems   All systems reviewed and negative except as stated in HPI  Blood pressure 128/75, pulse 90, temperature 98.6 F (37 C), temperature source Oral, resp. rate 18, height 5\' 5"  (1.651 m), weight 105.7 kg, SpO2 100%. General appearance: alert  Lungs: clear to auscultation bilaterally Heart: regular rate and rhythm Abdomen: soft, non-tender; bowel sounds normal Pelvic: Deferred until next SVE Extremities: Trace pitting edema bilaterally, stable per patient report  DTR's 2+ Presentation: cephalic Fetal monitoringBaseline: 125 bpm, Variability: Good {> 6 bpm), and Accelerations: Reactive Uterine activitySpontaenous contractions every 3-4 minutes, they were painful prior to epidural placement  Dilation: 1 Effacement (%): 90 Station: -2, -3 Exam by:: Duncan Dull   Prenatal labs: ABO, Rh: --/--/O POS (12/12 1330) Antibody: NEG (12/12 1330) Rubella: 2.48 (06/12 1120) RPR: Non Reactive (09/10 0842)  HBsAg: Negative (06/12 1120)  HIV: Non Reactive (09/10 0842)  GBS: Negative/-- (11/19 0828)  1 hr Glucola 159 Genetic screening  Low risk female  Anatomy US WNL  Prenatal Transfer Tool  Maternal Diabetes: No Genetic Screening: Normal Maternal  Ultrasounds/Referrals: Normal Fetal Ultrasounds or other Referrals:  None Maternal Substance Abuse:  No Significant Maternal Medications:  None Significant Maternal Lab Results:  Group B Strep negative Number of Prenatal Visits:greater than 3 verified prenatal visits Other Comments:  None  Results for orders placed or performed during the hospital encounter of 01/15/23 (from the past 24 hours)  POCT fern test   Collection Time: 01/15/23  1:01 PM  Result Value Ref Range   POCT Fern Test Positive = ruptured amniotic membanes   CBC   Collection Time: 01/15/23  1:30 PM  Result Value Ref Range   WBC 9.1 4.0 - 10.5 K/uL   RBC 3.64 (L) 3.87 - 5.11 MIL/uL   Hemoglobin 10.6 (L) 12.0 - 15.0 g/dL   HCT 16.1 (L) 09.6 - 04.5 %   MCV 90.1 80.0 - 100.0 fL   MCH 29.1 26.0 - 34.0 pg   MCHC 32.3 30.0 - 36.0 g/dL   RDW 40.9 81.1 - 91.4 %   Platelets 214 150 - 400 K/uL   nRBC 0.0 0.0 - 0.2 %  Type and screen   Collection Time: 01/15/23  1:30 PM  Result Value Ref Range   ABO/RH(D) O POS    Antibody Screen NEG    Sample Expiration      01/18/2023,2359 Performed at [facility], [address], [place], [place] [address]     Patient Active Problem List   Diagnosis Date Noted   Post-dates pregnancy 01/15/2023   Supervision of normal pregnancy 07/16/2022   Adjustment disorder with depressed mood 02/29/2020    Assessment/Plan:  Theresa Hubbard is a 22 y.o. G2P0010 at [redacted]w[redacted]d here for SROM at around 1030 this morning.   #Labor: Ctx initially regular, but spaced out some  cx now 3cm  discussed starting on Pitocin 2x2 and pt is amenable #Pain: Epidural in place, comfortable with contractions #FWB: Cat 1 #ID:  GBS neg #MOF: Breast #MOC: Undecided #Circ:  N/a female   Eliezer Mccoy, MD  01/15/2023, 3:07 PM  Attestation of Supervision of Resident:  I confirm that I have verified the information documented in the resident's note and that I have also personally reperformed  the history, physical exam and all medical decision making activities.  I have verified that all services and findings are accurately documented in this resident's note; and I agree with management and plan as outlined in the documentation. I have also made any necessary editorial changes.  Sundra Aland, MD OB Fellow, Faculty [facility], Center for [facility]  01/15/23 8:12 PM

## 2023-01-15 NOTE — Anesthesia Preprocedure Evaluation (Signed)
Anesthesia Evaluation  Patient identified by MRN, date of birth, ID band Patient awake    Reviewed: Allergy & Precautions, Patient's Chart, lab work & pertinent test results  Airway Mallampati: II  TM Distance: >3 FB Neck ROM: Full    Dental no notable dental hx.    Pulmonary neg pulmonary ROS   Pulmonary exam normal breath sounds clear to auscultation       Cardiovascular negative cardio ROS Normal cardiovascular exam Rhythm:Regular Rate:Normal     Neuro/Psych  PSYCHIATRIC DISORDERS  Depression    negative neurological ROS     GI/Hepatic negative GI ROS, Neg liver ROS,,,  Endo/Other  negative endocrine ROS  Obesity BMI 39  Renal/GU negative Renal ROS  negative genitourinary   Musculoskeletal negative musculoskeletal ROS (+)    Abdominal  (+) + obese  Peds negative pediatric ROS (+)  Hematology  (+) Blood dyscrasia, anemia Hb 10.6, plt 214   Anesthesia Other Findings   Reproductive/Obstetrics (+) Pregnancy                             Anesthesia Physical Anesthesia Plan  ASA: 2  Anesthesia Plan: Epidural   Post-op Pain Management:    Induction:   PONV Risk Score and Plan: 2  Airway Management Planned: Natural Airway  Additional Equipment: None  Intra-op Plan:   Post-operative Plan:   Informed Consent: I have reviewed the patients History and Physical, chart, labs and discussed the procedure including the risks, benefits and alternatives for the proposed anesthesia with the patient or authorized representative who has indicated his/her understanding and acceptance.       Plan Discussed with:   Anesthesia Plan Comments:        Anesthesia Quick Evaluation

## 2023-01-15 NOTE — MAU Note (Signed)
.  Theresa Hubbard is a 22 y.o. at [redacted]w[redacted]d here in MAU reporting: ROM at 1030, contractions since 2am. Reports positive fetal movement.  Onset of complaint: 1030 Pain score: 8/10 There were no vitals filed for this visit.   FHT:132 Lab orders placed from triage:

## 2023-01-15 NOTE — Progress Notes (Signed)
None      S: Ms. Theresa Hubbard is a 22 y.o. G2P0010 at [redacted]w[redacted]d  who presents to MAU today complaining of leaking of fluid since 1030. She denies vaginal bleeding. She endorses contractions. She reports normal fetal movement.    O: Ht 5\' 5"  (1.651 m)   Wt 105.7 kg   BMI 38.77 kg/m  GENERAL: Well-developed, well-nourished female in no acute distress.  HEAD: Normocephalic, atraumatic.  CHEST: Normal effort of breathing, regular heart rate ABDOMEN: Soft, nontender, gravid PELVIC: Normal external female genitalia. Vagina is pink and rugated. Cervix with normal contour, no lesions; active leaking noted from cervix. Normal discharge.  Positive pooling.   Cervical exam:  Dilation: 1 Effacement (%): 90 Cervical Position: Posterior Station: -2, -3 Presentation: Vertex Exam by:: Weston,RN   Fetal Monitoring: FHT: 125 bpm, Mod Var, -Decels, +Accels Toco: Q2-38min  Results for orders placed or performed during the hospital encounter of 01/15/23 (from the past 24 hours)  POCT fern test     Status: Abnormal   Collection Time: 01/15/23  1:01 PM  Result Value Ref Range   POCT Fern Test Positive = ruptured amniotic membanes      A: SIUP at [redacted]w[redacted]d  SROM  P: Admit to labor. Notify L&D team  Gerrit Heck, [place] 01/15/2023

## 2023-01-15 NOTE — Anesthesia Procedure Notes (Signed)
Epidural Patient location during procedure: OB Start time: 01/15/2023 2:39 PM End time: 01/15/2023 2:49 PM  Staffing Anesthesiologist: Lannie Fields, DO Performed: anesthesiologist   Preanesthetic Checklist Completed: patient identified, IV checked, risks and benefits discussed, monitors and equipment checked, pre-op evaluation and timeout performed  Epidural Patient position: sitting Prep: DuraPrep and site prepped and draped Patient monitoring: continuous pulse ox, blood pressure, heart rate and cardiac monitor Approach: midline Location: L3-L4 Injection technique: LOR air  Needle:  Needle type: Tuohy  Needle gauge: 17 G Needle length: 9 cm Needle insertion depth: 7 cm Catheter type: closed end flexible Catheter size: 19 Gauge Catheter at skin depth: 12 cm Test dose: negative  Assessment Sensory level: T8 Events: blood not aspirated, no cerebrospinal fluid, injection not painful, no injection resistance, no paresthesia and negative IV test  Additional Notes Patient identified. Risks/Benefits/Options discussed with patient including but not limited to bleeding, infection, nerve damage, paralysis, failed block, incomplete pain control, headache, blood pressure changes, nausea, vomiting, reactions to medication both or allergic, itching and postpartum back pain. Confirmed with bedside nurse the patient's most recent platelet count. Confirmed with patient that they are not currently taking any anticoagulation, have any bleeding history or any family history of bleeding disorders. Patient expressed understanding and wished to proceed. All questions were answered. Sterile technique was used throughout the entire procedure. Please see nursing notes for vital signs. Test dose was given through epidural catheter and negative prior to continuing to dose epidural or start infusion. Warning signs of high block given to the patient including shortness of breath, tingling/numbness in  hands, complete motor block, or any concerning symptoms with instructions to call for help. Patient was given instructions on fall risk and not to get out of bed. All questions and concerns addressed with instructions to call with any issues or inadequate analgesia.  Reason for block:procedure for pain

## 2023-01-16 ENCOUNTER — Encounter (HOSPITAL_COMMUNITY): Payer: Self-pay | Admitting: Family Medicine

## 2023-01-16 DIAGNOSIS — O4202 Full-term premature rupture of membranes, onset of labor within 24 hours of rupture: Secondary | ICD-10-CM | POA: Diagnosis not present

## 2023-01-16 DIAGNOSIS — O135 Gestational [pregnancy-induced] hypertension without significant proteinuria, complicating the puerperium: Secondary | ICD-10-CM | POA: Diagnosis not present

## 2023-01-16 DIAGNOSIS — Z3A39 39 weeks gestation of pregnancy: Secondary | ICD-10-CM | POA: Diagnosis not present

## 2023-01-16 DIAGNOSIS — O411230 Chorioamnionitis, third trimester, not applicable or unspecified: Secondary | ICD-10-CM | POA: Diagnosis not present

## 2023-01-16 DIAGNOSIS — O43813 Placental infarction, third trimester: Secondary | ICD-10-CM | POA: Diagnosis not present

## 2023-01-16 LAB — RPR: RPR Ser Ql: NONREACTIVE

## 2023-01-16 MED ORDER — ACETAMINOPHEN 500 MG PO TABS
1000.0000 mg | ORAL_TABLET | Freq: Four times a day (QID) | ORAL | Status: DC | PRN
  Administered 2023-01-16: 1000 mg via ORAL
  Filled 2023-01-16: qty 2

## 2023-01-16 MED ORDER — SENNOSIDES-DOCUSATE SODIUM 8.6-50 MG PO TABS
2.0000 | ORAL_TABLET | Freq: Every day | ORAL | Status: DC
  Administered 2023-01-17 – 2023-01-18 (×2): 2 via ORAL
  Filled 2023-01-16 (×2): qty 2

## 2023-01-16 MED ORDER — BENZOCAINE-MENTHOL 20-0.5 % EX AERO: 1.0000 | INHALATION_SPRAY | CUTANEOUS | Status: DC | PRN

## 2023-01-16 MED ORDER — CEFAZOLIN SODIUM-DEXTROSE 2-4 GM/100ML-% IV SOLN
2.0000 g | Freq: Three times a day (TID) | INTRAVENOUS | Status: DC
  Administered 2023-01-16: 2 g via INTRAVENOUS
  Filled 2023-01-16 (×2): qty 100

## 2023-01-16 MED ORDER — ONDANSETRON HCL 4 MG/2ML IJ SOLN: 4.0000 mg | INTRAMUSCULAR | Status: DC | PRN

## 2023-01-16 MED ORDER — COCONUT OIL OIL: 1.0000 | TOPICAL_OIL | Status: DC | PRN

## 2023-01-16 MED ORDER — DIPHENHYDRAMINE HCL 25 MG PO CAPS: 25.0000 mg | ORAL_CAPSULE | Freq: Four times a day (QID) | ORAL | Status: DC | PRN

## 2023-01-16 MED ORDER — MISOPROSTOL 200 MCG PO TABS
800.0000 ug | ORAL_TABLET | Freq: Once | ORAL | Status: AC
  Administered 2023-01-16: 800 ug via RECTAL

## 2023-01-16 MED ORDER — METHYLERGONOVINE MALEATE 0.2 MG/ML IJ SOLN
INTRAMUSCULAR | Status: AC
  Filled 2023-01-16: qty 1

## 2023-01-16 MED ORDER — WITCH HAZEL-GLYCERIN EX PADS: 1.0000 | MEDICATED_PAD | CUTANEOUS | Status: DC | PRN

## 2023-01-16 MED ORDER — PRENATAL MULTIVITAMIN CH
1.0000 | ORAL_TABLET | Freq: Every day | ORAL | Status: DC
  Administered 2023-01-17 – 2023-01-18 (×2): 1 via ORAL
  Filled 2023-01-16 (×2): qty 1

## 2023-01-16 MED ORDER — DIBUCAINE (PERIANAL) 1 % EX OINT: 1.0000 | TOPICAL_OINTMENT | CUTANEOUS | Status: DC | PRN

## 2023-01-16 MED ORDER — MISOPROSTOL 200 MCG PO TABS
ORAL_TABLET | ORAL | Status: AC
  Filled 2023-01-16: qty 4

## 2023-01-16 MED ORDER — ACETAMINOPHEN 500 MG PO TABS: 1000.0000 mg | ORAL_TABLET | ORAL | Status: DC | PRN

## 2023-01-16 MED ORDER — ONDANSETRON HCL 4 MG PO TABS: 4.0000 mg | ORAL_TABLET | ORAL | Status: DC | PRN

## 2023-01-16 MED ORDER — SIMETHICONE 80 MG PO CHEW: 80.0000 mg | CHEWABLE_TABLET | ORAL | Status: DC | PRN

## 2023-01-16 MED ORDER — IBUPROFEN 600 MG PO TABS
600.0000 mg | ORAL_TABLET | Freq: Four times a day (QID) | ORAL | Status: DC
  Administered 2023-01-16 – 2023-01-18 (×8): 600 mg via ORAL
  Filled 2023-01-16 (×8): qty 1

## 2023-01-16 MED ORDER — CEFAZOLIN SODIUM-DEXTROSE 2-4 GM/100ML-% IV SOLN
2.0000 g | Freq: Three times a day (TID) | INTRAVENOUS | Status: AC
  Administered 2023-01-16 – 2023-01-17 (×3): 2 g via INTRAVENOUS
  Filled 2023-01-16 (×3): qty 100

## 2023-01-16 MED ORDER — METHYLERGONOVINE MALEATE 0.2 MG/ML IJ SOLN
0.2000 mg | Freq: Once | INTRAMUSCULAR | Status: AC
  Administered 2023-01-16: 0.2 mg via INTRAMUSCULAR

## 2023-01-16 MED ORDER — TETANUS-DIPHTH-ACELL PERTUSSIS 5-2.5-18.5 LF-MCG/0.5 IM SUSY: 0.5000 mL | PREFILLED_SYRINGE | Freq: Once | INTRAMUSCULAR | Status: DC

## 2023-01-16 NOTE — Progress Notes (Signed)
Patient ID: Theresa Hubbard, female   DOB: [birth year], 22 y.o.   MRN: [id number] Sleeping  Vitals:   01/16/23 0001 01/16/23 0026 01/16/23 0031 01/16/23 0101  BP: (!) 146/84  101/65 121/65  Pulse: (!) 159  (!) 103 90  Resp: 15  16 16   Temp: 98.7 F (37.1 C) 99.1 F (37.3 C)    TempSrc: Oral Oral    SpO2:      Weight:      Height:       FHR stable UCs every 2-49min  Last exam at 2357 Dilation: 7 (shifted, more on left side) Effacement (%): 80 Cervical Position: Posterior Station: -1, 0 Presentation: Vertex Exam by:: L Strickland RN  Will recheck shortly and if no change will place IUPC

## 2023-01-16 NOTE — Progress Notes (Signed)
LABOR NOTE Theresa Hubbard is a 22 y.o. G2P0010 at [redacted]w[redacted]d admitted for SROM  Subjective: comfortable with epidural, began pushing at 1157  Objective: BP 122/67   Pulse (!) 132   Temp 99.5 F (37.5 C) (Axillary)   Resp 20   Ht 5\' 5"  (1.651 m)   Wt 105.7 kg   SpO2 95%   BMI 38.77 kg/m  Total I/O In: -  Out: 150 [Urine:150]  FHR baseline 130 bpm, Variability: moderate, Accelerations:present, Decelerations:  variables  Toco: q 3-4 mins   SVE:   Dilation: 10 Effacement (%): 100 Station: Plus 1 Exam by:: Johnathan Hausen RN  Pitocin @ 22 mu/min  Labs: Lab Results  Component Value Date   WBC 9.1 01/15/2023   HGB 10.6 (L) 01/15/2023   HCT 32.8 (L) 01/15/2023   MCV 90.1 01/15/2023   PLT 214 01/15/2023    Assessment / Plan: Spontaneous labor, progressing normally began pushing at 1157 comfortable with epidural making progress with pushing   Labor: active Fetal Wellbeing:  Category I Pain Control:  epidural Pre-eclampsia: N/A I/D:  GBS neg Anticipated MOD: NSVB  Cleda Mccreedy SNM 01/16/2023, 12:36 PM

## 2023-01-16 NOTE — Lactation Note (Signed)
This note was copied from a baby's chart. Lactation Consultation Note  Patient Name: Girl Jouri Decarla Lybarger [id number] Date: 01/16/2023 Age:23 hours  Mom sitting in recliner rocking and holding baby. Baby has the hiccups. Mom stated she feels that BF going well. Mom is doing BF/formula feeding. Asked mom to call for [facility] for next feeding. Mom stated OK.   Maternal Data    Feeding    LATCH Score                    Lactation Tools Discussed/Used    Interventions    Discharge    Consult Status      Charyl Dancer 01/16/2023, 8:04 PM

## 2023-01-16 NOTE — Discharge Summary (Signed)
Postpartum Discharge Summary  Date of Service updated***     Patient Name: Theresa Hubbard DOB: 05/03/2000 MRN: [id number]  Date of admission: 01/15/2023 Delivery date:01/16/2023 Delivering provider: Dia Sitter B Date of discharge: 01/16/2023  Admitting diagnosis: Post-dates pregnancy [O48.0] Intrauterine pregnancy: [redacted]w[redacted]d     Secondary diagnosis:  Principal Problem:   Post-dates pregnancy  Additional problems: ***    Discharge diagnosis: Gestational Hypertension                                              Post partum procedures:{Postpartum procedures:23558} Augmentation: Pitocin Complications: ROM>24 hours  Hospital course: Onset of Labor With Vaginal Delivery      22 y.o. yo G2P1011 at [redacted]w[redacted]d was admitted in Active Labor on 01/15/2023. Labor course was complicated by ROM >24 hrs, uterine sweep for atony, on ancef.  Membrane Rupture Time/Date: 10:30 AM,01/15/2023  Delivery Method:Vaginal, Spontaneous Operative Delivery:N/A Episiotomy: None Lacerations:  1st degree Patient had a postpartum course complicated by ***.  She is ambulating, tolerating a regular diet, passing flatus, and urinating well. Patient is discharged home in stable condition on 01/16/23.  Newborn Data: Birth date:01/16/2023 Birth time:12:56 PM Gender:Female Living status:Living Apgars:8 ,9  Weight:8 lb 10.6 oz (3.93 kg)  Magnesium Sulfate received: No BMZ received: No Rhophylac:N/A MMR:N/A T-DaP:Given prenatally Flu: N/A RSV Vaccine received: No Transfusion:No  Immunizations received: Immunization History  Administered Date(s) Administered   DTaP 03/23/2000, 05/18/2000, 07/31/2000, 05/06/2001, 05/22/2004   H1N1 11/27/2007, 12/25/2007   HIB (PRP-OMP) 03/23/2000, 05/18/2000, 07/31/2000, 02/05/2001   HPV Quadrivalent 05/20/2011, 07/25/2011, 11/24/2011   Hepatitis A 05/23/2005, 07/16/2006   Hepatitis B 01-13-00, 03/23/2000, 07/31/2000   IPV 03/23/2000, 05/18/2000,  02/05/2001, 05/22/2004   Influenza, Seasonal, Injecte, Preservative Fre 11/10/2022   Influenza,Quad,Nasal, Live 12/21/2013   Influenza,inj,Quad PF,6+ Mos 12/14/2014, 11/16/2015, 01/01/2017, 11/13/2017, 11/26/2018, 11/10/2020   Influenza-Unspecified 11/24/2002, 12/22/2002, 12/02/2003, 11/30/2004, 01/17/2006, 12/05/2006, 12/25/2007, 11/25/2008, 12/01/2009, 01/04/2011, 11/15/2011, 11/13/2012   MMR 02/05/2001, 05/22/2004   Meningococcal Conjugate 05/20/2011, 01/01/2017   Pneumococcal-Unspecified 03/23/2000, 05/18/2000, 07/31/2000   Td 05/20/2011   Tdap 05/20/2011, 11/10/2022   Varicella 02/05/2001, 05/23/2005    Physical exam  Vitals:   01/16/23 1429 01/16/23 1451 01/16/23 1628 01/16/23 1758  BP: 123/75 135/80 134/78   Pulse: (!) 104 83 96   Resp:  20 18   Temp:  99.5 F (37.5 C) (!) 102.7 F (39.3 C) (!) 100.4 F (38 C)  TempSrc:  Oral Oral Oral  SpO2:  100%    Weight:      Height:       General: {Exam; general:21111117} Lochia: {Desc; appropriate/inappropriate:30686::"appropriate"} Uterine Fundus: {Desc; firm/soft:30687} Incision: {Exam; incision:21111123} DVT Evaluation: {Exam; dvt:2111122} Labs: Lab Results  Component Value Date   WBC 9.1 01/15/2023   HGB 10.6 (L) 01/15/2023   HCT 32.8 (L) 01/15/2023   MCV 90.1 01/15/2023   PLT 214 01/15/2023      Latest Ref Rng & Units 10/15/2022    6:12 AM  CMP  Glucose 70 - 99 mg/dL 98   BUN 6 - 20 mg/dL 6   Creatinine 1.19 - 1.47 mg/dL 8.29   Sodium 562 - 130 mmol/L 136   Potassium 3.5 - 5.1 mmol/L 3.9   Chloride 98 - 111 mmol/L 107   CO2 22 - 32 mmol/L 23   Calcium 8.9 - 10.3 mg/dL 7.7   Total Protein 6.5 -  8.1 g/dL 5.9   Total Bilirubin 0.3 - 1.2 mg/dL 0.3   Alkaline Phos 38 - 126 U/L 63   AST 15 - 41 U/L 13   ALT 0 - 44 U/L 9    Edinburgh Score:     No data to display         No data recorded  After visit meds:  Allergies as of 01/16/2023   No Known Allergies   Med Rec must be completed prior to using  this [facility]***     Discharge home in stable condition Infant Feeding: {Baby feeding:23562} Infant Disposition:{CHL IP OB HOME WITH WUJWJX:91478} Discharge instruction: per After Visit Summary and Postpartum booklet. Activity: Advance as tolerated. Pelvic rest for 6 weeks.  Diet: {OB GNFA:21308657} Future Appointments: Future Appointments  Date Time Provider Department Center  01/20/2023  8:50 AM Rasch, Harolyn Rutherford, NP [facility]   Follow up Visit: Message sent to [facility] on 12/13 by Edd Arbour, CNM Please schedule this patient for a In person postpartum visit in 4 weeks with the following provider: Any provider. Additional Postpartum F/U:BP check 1 week  Low risk pregnancy complicated by: HTN Delivery mode:  Vaginal, Spontaneous Anticipated Birth Control:  Unsure   01/16/2023 Bernerd Limbo, CNM

## 2023-01-16 NOTE — Progress Notes (Signed)
Labor Progress Note Theresa Hubbard is a 22 y.o. G2P0010 at [redacted]w[redacted]d presented for SROM  S: Support persons at bedside. In throne position. Feeling pain more on right side. Swollen hands/feet. No HA, vision changes or RUQ pain.  O:  BP (!) 122/54   Pulse 97   Temp 98.9 F (37.2 C) (Axillary)   Resp 16   Ht 5\' 5"  (1.651 m)   Wt 105.7 kg   SpO2 96%   BMI 38.77 kg/m  Extremities: 1+ nonpitting edema EFM: 130/moderate variability/+ accels/early decels  CVE: Dilation: 10 Dilation Complete Date: 01/16/23 Dilation Complete Time: 0854 Effacement (%): 100 Cervical Position: Posterior Station: Plus 1 Presentation: Vertex Exam by:: Johnathan Hausen RN   A&P: 22 y.o. G2P0010 [redacted]w[redacted]d for SROM #Labor: Progressing well. Increase pitocin given ctx spaced out q33min. Anticipate SVD #Pain: Epidural #FWB: Cat 1 #GBS negative   Gwenlyn Perking, MD 10:55 AM

## 2023-01-16 NOTE — Progress Notes (Signed)
Patient ID: Theresa Hubbard, female   DOB: 2000/06/15, 22 y.o.   MRN: [id number] Vitals:   01/16/23 0026 01/16/23 0031 01/16/23 0101 01/16/23 0338  BP:  101/65 121/65   Pulse:  (!) 103 90   Resp:  16 16   Temp: 99.1 F (37.3 C)   99 F (37.2 C)  TempSrc: Oral   Oral  SpO2:      Weight:      Height:       FHR reassuring UCs irregular  Dilation: Lip/rim Effacement (%): 90 Cervical Position: Posterior Station: 0 Presentation: Vertex Exam by:: Duffy Rhody, RNC  Will anticipate SVD

## 2023-01-17 LAB — CBC
HCT: 25.7 % — ABNORMAL LOW (ref 36.0–46.0)
Hemoglobin: 8.6 g/dL — ABNORMAL LOW (ref 12.0–15.0)
MCH: 29.4 pg (ref 26.0–34.0)
MCHC: 33.5 g/dL (ref 30.0–36.0)
MCV: 87.7 fL (ref 80.0–100.0)
Platelets: 167 10*3/uL (ref 150–400)
RBC: 2.93 MIL/uL — ABNORMAL LOW (ref 3.87–5.11)
RDW: 13.9 % (ref 11.5–15.5)
WBC: 14 10*3/uL — ABNORMAL HIGH (ref 4.0–10.5)
nRBC: 0 % (ref 0.0–0.2)

## 2023-01-17 MED ORDER — FUROSEMIDE 20 MG PO TABS
20.0000 mg | ORAL_TABLET | Freq: Every day | ORAL | Status: DC
  Administered 2023-01-17 – 2023-01-18 (×2): 20 mg via ORAL
  Filled 2023-01-17 (×2): qty 1

## 2023-01-17 MED ORDER — FERROUS GLUCONATE 324 (38 FE) MG PO TABS
324.0000 mg | ORAL_TABLET | ORAL | Status: DC
  Administered 2023-01-17: 324 mg via ORAL
  Filled 2023-01-17: qty 1

## 2023-01-17 NOTE — Progress Notes (Signed)
CSW received consult for hx of Anxiety, Depression, and adjustment disorder. CSW met with MOB to offer support and complete assessment. When CSW entered room, infant was asleep in bassinet, MOB was sitting in hospital bed. Visitors and FOB were present in room. CSW introduced self and requested visitors to leave. Visitors left room, MOB provided verbal consent for FOB to remain in room. CSW explained reason for consult. MOB presented as calm and agreeable to consult.   CSW inquired how MOB has felt emotionally since infant's arrival. MOB reports she felt "very anxious" throughout the night, reporting fear about baby's breathing, "making sure baby is okay" and feelings of overwhelm when baby was crying. MOB reports she was not able to sleep due to anxious feelings. MOB shares FOB was in the room throughout the night but he was difficult to wake due to limited sleep the day before. CSW provided emotional support and inquired about additional supports to assist with infant post discharge. MOB reports her parents and FOB's parents are supportive and can assist with baby to take shifts so that MOB can sleep if needed.  CSW inquired about MOB's mental health history. MOB reports she was diagnosed with depression and anxiety in Middle School. MOB reports she was prescribed an antidepressant (was unable to recall name) for a short time in high school but her father did not approve so she discontinued the medication. MOB reports she has met with a therapist in the past but did not connect well with them. MOB is not current with medication or therapy. MOB declined mental health resources at this time. CSW encouraged MOB to contact her OBGYN if symptoms of anxiety and overwhelm persist or impact her ability to enjoy parenting.   CSW inquired about symptoms of depression/anxiety during pregnancy. MOB identified telling her parents she was pregnant as a significant stressor, sharing it caused conflict between her family  and her father ignored her afterwards. MOB reports conflict with her family has since resolved and shares that her parents are now supportive. MOB reports endorsing the fear that she would not be a good mother while pregnant. After sharing, FOB was observed sharing supportive words and affirmed MOB. MOB reports coping skills including talking to her friends and family and listening to music.  CSW provided education regarding the baby blues period vs. perinatal mood disorders, discussed treatment and gave resources for mental health follow up if concerns arise.  CSW recommends self-evaluation during the postpartum time period using the New Mom Checklist from Postpartum Progress and encouraged MOB to contact a medical professional if symptoms are noted at any time. MOB denied current SI/HI. DV was not assessed due to FOB being present.  MOB reports she has all needed items for infant, including a car seat and bassinet. MOB reports she will follow up with [facility] for infant's pediatric care.   CSW provided review of Sudden Infant Death Syndrome (SIDS) precautions.    CSW identifies no further need for intervention and no barriers to discharge at this time.  Signed,  Norberto Sorenson, MSW, LCSWA, LCASA 2022/05/10 12:15 PM

## 2023-01-17 NOTE — Lactation Note (Signed)
This note was copied from a baby's chart. Lactation Consultation Note  Patient Name: Theresa Hubbard ZOXWR'U Date: 01/17/2023 Age:22 hours Reason for consult: Initial assessment;1st time breastfeeding  P1, Mother easily hand expressed flow of colostrum.  Unwrapped baby and placed her skin to skin to interest her in feeding.  Provided volume guidelines and encouraged offering breast before formula. Assisted latching in cross cradle hold and baby latched with ease, noted frequent swallows. Feed on demand with cues.  Goal 8-12+ times per day after first 24 hrs.  Place baby STS if not cueing.    Maternal Data Has patient been taught Hand Expression?: Yes Does the patient have breastfeeding experience prior to this delivery?: No  Feeding Mother's Current Feeding Choice: Breast Milk and Formula  LATCH Score Latch: Grasps breast easily, tongue down, lips flanged, rhythmical sucking.  Audible Swallowing: Spontaneous and intermittent  Type of Nipple: Everted at rest and after stimulation  Comfort (Breast/Nipple): Soft / non-tender  Hold (Positioning): Assistance needed to correctly position infant at breast and maintain latch.  LATCH Score: 9  Interventions Interventions: Breast feeding basics reviewed;Assisted with latch;Skin to skin;Hand express;Education;LC Services brochure  Consult Status Consult Status: Follow-up Date: 01/18/23 Follow-up type: In-patient    Hardie Pulley  RN, IBCLC 01/17/2023, 9:04 AM

## 2023-01-17 NOTE — Anesthesia Postprocedure Evaluation (Signed)
Anesthesia Post Note  Patient: Alante Oceanographer  Procedure(s) Performed: AN AD HOC LABOR EPIDURAL     Patient location during evaluation: Mother Baby Anesthesia Type: Epidural Level of consciousness: awake and alert Pain management: pain level controlled Vital Signs Assessment: post-procedure vital signs reviewed and stable Respiratory status: spontaneous breathing, nonlabored ventilation and respiratory function stable Cardiovascular status: stable Postop Assessment: no headache, no backache and epidural receding Anesthetic complications: no   No notable events documented.  Last Vitals:  Vitals:   01/17/23 0035 01/17/23 0410  BP: 129/71 124/86  Pulse: 89 84  Resp: 18 16  Temp: 36.9 C 36.6 C  SpO2: 97% 97%    Last Pain:  Vitals:   01/17/23 0410  TempSrc: Oral  PainSc: 0-No pain   Pain Goal:                   [facility] Records

## 2023-01-17 NOTE — Progress Notes (Signed)
POSTPARTUM PROGRESS NOTE  Subjective: Theresa Hubbard is a 22 y.o. G2P1011 s/p NSVD at [redacted]w[redacted]d.  She reports she is doing well. No acute events overnight. She denies any problems with ambulating, voiding or po intake. Denies nausea or vomiting. She has passed flatus. Pain is well controlled.  Lochia is like a period. Denies feeling feverish/chilled.  Objective: Blood pressure 124/86, pulse 84, temperature 97.9 F (36.6 C), temperature source Oral, resp. rate 16, height 5\' 5"  (1.651 m), weight 105.7 kg, SpO2 97%, unknown if currently breastfeeding.  Physical Exam:  General: alert, cooperative and no distress Chest: no respiratory distress Abdomen: soft, non-tender  Uterine Fundus: firm and at level of umbilicus Extremities: No calf swelling or tenderness  1-2+ pitting edema  Recent Labs    01/15/23 1330  HGB 10.6*  HCT 32.8*    Assessment/Plan: Theresa Hubbard is a 22 y.o. G2P1011 s/p NSVD at [redacted]w[redacted]d.  Routine Postpartum Care: Doing well, pain well-controlled.  -- Continue routine care, lactation support  -- Contraception: unsure -- Feeding: breast -- Extremities without signs of DVT but with edema. Lasix 20 mg every day x3 -- Delivery c/b uterine sweep. Ancef x24 hours. Afebrile since starting. Bleeding appropriate. AM CBC pending  Dispo: Plan for discharge 12/15.  Joanne Gavel, MD OB Fellow 01/17/2023 5:50 AM

## 2023-01-17 NOTE — Lactation Note (Signed)
This note was copied from a baby's chart. Lactation Consultation Note  Patient Name: Theresa Hubbard ZOXWR'U Date: 01/17/2023 Age:23 hours  Went back to rm. Baby sleepy not interested in BF at this time. Encouraged mom to rest while baby resting. Set alarm and try again w/cues or every 3 hrs.    Maternal Data    Feeding    LATCH Score                    Lactation Tools Discussed/Used    Interventions    Discharge    Consult Status      Charyl Dancer 01/17/2023, 12:15 AM

## 2023-01-18 DIAGNOSIS — O165 Unspecified maternal hypertension, complicating the puerperium: Secondary | ICD-10-CM | POA: Diagnosis not present

## 2023-01-18 DIAGNOSIS — D62 Acute posthemorrhagic anemia: Secondary | ICD-10-CM | POA: Diagnosis not present

## 2023-01-18 MED ORDER — ACETAMINOPHEN 500 MG PO TABS: 1000.0000 mg | ORAL_TABLET | Freq: Four times a day (QID) | ORAL | 0 refills | Status: DC | PRN

## 2023-01-18 MED ORDER — IBUPROFEN 600 MG PO TABS: 600.0000 mg | ORAL_TABLET | Freq: Four times a day (QID) | ORAL | 0 refills | Status: DC

## 2023-01-18 MED ORDER — FUROSEMIDE 20 MG PO TABS: 20.0000 mg | ORAL_TABLET | Freq: Every day | ORAL | 0 refills | Status: DC

## 2023-01-18 MED ORDER — FERROUS GLUCONATE 324 (38 FE) MG PO TABS: 324.0000 mg | ORAL_TABLET | ORAL | 3 refills | Status: DC

## 2023-01-18 NOTE — Lactation Note (Signed)
This note was copied from a baby's chart. Lactation Consultation Note  Patient Name: Theresa Hubbard [id number]'M Date: 01/18/2023 Age:22 Reason for consult: Follow-up assessment;1st time breastfeeding (weight loss -6.23%) MOB current feeding plan is breast feeding and supplementing with formula. MOB will continue to supplement infant until her milk supply increases and infant regain birth weight. MOB will latch infant 1st for every feeding and afterwards will supplement infant with formula. MOB latched infant on her right breast with pillow support using the cradle hold, swallows observed, infant sustained her latch and was still BF after 12 minutes when LC left the room. MOB informed LC infant's stool transition today to brown color.   Current feeding plan: 1- MOB will continue to BF infant by cues, on demand, every 2-3 hours, skin to skin. 2- MOB will supplement infant on day 3 after latch with ( 18-25 mls) of 20 kcal formula, this is her feeding choice. 3- LC reviewed importance of maternal rest, balance diet and hydration.   Maternal Data    Feeding Mother's Current Feeding Choice: Breast Milk and Formula  LATCH Score Latch: Grasps breast easily, tongue down, lips flanged, rhythmical sucking.  Audible Swallowing: Spontaneous and intermittent  Type of Nipple: Everted at rest and after stimulation  Comfort (Breast/Nipple): Soft / non-tender  Hold (Positioning): Assistance needed to correctly position infant at breast and maintain latch.  LATCH Score: 9   Lactation Tools Discussed/Used    Interventions Interventions: Skin to skin;Assisted with latch;Breast compression;Adjust position;Support pillows;Position options;Education  Discharge    Consult Status Consult Status: Follow-up Date: 01/19/23 Follow-up type: In-patient    Frederico Hamman 01/18/2023, 4:21 PM

## 2023-01-19 ENCOUNTER — Telehealth: Payer: Self-pay | Admitting: *Deleted

## 2023-01-19 NOTE — Telephone Encounter (Signed)
Left patient a message to call and schedule 4 week Postpartum appointment starting the week of Monday, 02/16/2023.

## 2023-01-19 NOTE — Telephone Encounter (Signed)
-----   Message from Bernerd Limbo sent at 01/16/2023  7:38 PM EST ----- Regarding: PP Follow Up Please schedule this patient for in-person postpartum visit in 4 weeks with any provider For C/S patients schedule nurse incision check in weeks 2 weeks: no Low risk pregnancy complicated by: HTN Delivery mode:  SVD Anticipated Birth Control:  other/unsure PP Procedures needed: BP check  Edinburgh: pending  Schedule Integrated BH visit: no  No relevant baby issues

## 2023-01-20 ENCOUNTER — Encounter: Payer: Medicaid Other | Admitting: Obstetrics and Gynecology

## 2023-01-20 LAB — SURGICAL PATHOLOGY

## 2023-01-24 ENCOUNTER — Telehealth (HOSPITAL_COMMUNITY): Payer: Self-pay

## 2023-01-24 NOTE — Telephone Encounter (Signed)
01/24/2023 1315  Name: Theresa Hubbard MRN: [id number] DOB: 2000-06-02  Reason for Call:  Transition of Care Hospital Discharge Call  Contact Status: Patient Contact Status: Message  Language assistant needed:          Follow-Up Questions:    Inocente Salles Postnatal Depression Scale:  In the Past 7 Days:    PHQ2-9 Depression Scale:     Discharge Follow-up:    Post-discharge interventions: NA  Signature  Signe Colt

## 2023-01-25 ENCOUNTER — Inpatient Hospital Stay (HOSPITAL_COMMUNITY): Admission: RE | Admit: 2023-01-25 | Payer: Medicaid Other | Source: Home / Self Care | Admitting: Family Medicine

## 2023-01-25 ENCOUNTER — Inpatient Hospital Stay (HOSPITAL_COMMUNITY): Payer: Medicaid Other

## 2023-02-13 NOTE — Progress Notes (Signed)
 Post Partum Visit Note  Theresa Hubbard is a 24 y.o. G71P1011 female who presents for a postpartum visit. She is 4 weeks postpartum following a normal spontaneous vaginal delivery.  I have fully reviewed the prenatal and intrapartum course. The delivery was at 39.8 gestational weeks.  Anesthesia: epidural. Postpartum course has been unremarkable. Baby is doing well. Baby is feeding by breast. Bleeding thin lochia. Bowel function is abnormal: constipated . Bladder function is normal. Patient is not sexually active. Contraception method is none. Postpartum depression screening: positive- score 13.  Worried about PP anxiety. Really scared of SIDS and keeps getting scary things on social media. Has good support from her parents and her partner. Has struggled with anx/dep in the past, has been to therapist and on meds in past.    [facility] - 02/16/23 1102       Pregnancy Intention Screening   Does the patient want to become pregnant in the next year? No    Does the patient's partner want to become pregnant in the next year? No    Would the patient like to discuss contraceptive options today? No      Contraception Wrap Up   Current Method Abstinence    End Method Abstinence    Contraception Counseling Provided No    How was the end contraceptive method provided? N/A            The pregnancy intention screening data noted above was reviewed. Potential methods of contraception were discussed. The patient elected to proceed with Abstinence.   Edinburgh Postnatal Depression Scale - 02/16/23 0959       Edinburgh Postnatal Depression Scale:  In the Past 7 Days   I have been able to laugh and see the funny side of things. 0    I have looked forward with enjoyment to things. 1    I have blamed myself unnecessarily when things went wrong. 2    I have been anxious or worried for no good reason. 3    I have felt scared or panicky for no good reason. 2    Things have been getting on  top of me. 2    I have been so unhappy that I have had difficulty sleeping. 1    I have felt sad or miserable. 1    I have been so unhappy that I have been crying. 1    The thought of harming myself has occurred to me. 0    Edinburgh Postnatal Depression Scale Total 13             Health Maintenance Due  Topic Date Due   COVID-19 Vaccine (1 - 2024-25 season) Never done    The following portions of the patient's history were reviewed and updated as appropriate: allergies, current medications, past family history, past medical history, past social history, past surgical history, and problem list.  Review of Systems Pertinent items are noted in HPI.  Objective:  BP 108/73   Pulse 73   Ht 5' 5 (1.651 m)   Wt 183 lb (83 kg)   Breastfeeding Yes   BMI 30.45 kg/m    General:  alert, cooperative, and no distress   Breasts:  not indicated  Lungs: Normal effort  Heart:  Normal rate  Abdomen: Soft, non tender   GU exam:  normal - performed in presence of chaperone. Stitches completely dissolved       Assessment:   Postpartum exam Normal  Postpartum anxiety Offered  SSRI, pt declines. Would prefer to meet with IBH first. No SI -     Ambulatory referral to Integrated Behavioral Health  Postpartum hypertension Normotensive today, no antihypertensives   Plan:   Essential components of care per ACOG recommendations:  1.  Mood and well being: Patient with positive depression screening today. Reviewed local resources for support.  - Patient tobacco use? No.   - hx of drug use? No.    2. Infant care and feeding:  -Patient currently breastmilk feeding? Yes. Reviewed importance of draining breast regularly to support lactation.  -Social determinants of health (SDOH) reviewed in EPIC. The following needs were identified: positive depression screening  3. Sexuality, contraception and birth spacing - Patient does not want a pregnancy in the next year.  Desired family size is 4  children.  - Reviewed reproductive life planning. Reviewed contraceptive methods based on pt preferences and effectiveness.  Patient declines today.   - Discussed birth spacing of 18 months  4. Sleep and fatigue -Encouraged family/partner/community support of 4 hrs of uninterrupted sleep to help with mood and fatigue  5. Physical Recovery  - Discussed patients delivery and complications. She describes her labor as good. - Patient had a C-section, no problems at delivery. Patient had a 1st degree laceration. Perineal healing reviewed. Patient expressed understanding - Patient has urinary incontinence? No. - Patient is safe to resume physical and sexual activity  6.  Health Maintenance - HM due items addressed Yes - Last pap smear  Diagnosis  Date Value Ref Range Status  07/16/2022   Final   - Negative for intraepithelial lesion or malignancy (NILM)   Pap smear not done at today's visit.  -Breast Cancer screening indicated? No.   7. Chronic Disease/Pregnancy Condition follow up: None  Kieth JAYSON Carolin, MD [facility] [employer], [facility]

## 2023-02-16 ENCOUNTER — Ambulatory Visit: Payer: Medicaid Other | Admitting: Obstetrics and Gynecology

## 2023-02-16 ENCOUNTER — Encounter: Payer: Self-pay | Admitting: Obstetrics and Gynecology

## 2023-02-16 DIAGNOSIS — O99345 Other mental disorders complicating the puerperium: Secondary | ICD-10-CM | POA: Diagnosis not present

## 2023-02-16 DIAGNOSIS — F418 Other specified anxiety disorders: Secondary | ICD-10-CM

## 2023-02-16 DIAGNOSIS — O165 Unspecified maternal hypertension, complicating the puerperium: Secondary | ICD-10-CM | POA: Diagnosis not present

## 2023-02-16 NOTE — Patient Instructions (Addendum)
 Helpful things for constipation  - Kiwi fruit with skin on (only 1 per day!) - Increase fiber in diet and drink lots of water - Over the counter laxatives - Miralax or senna daily. Senna can cause more cramping, so Miralax might be most helpful. Drink with lots of water or you can get dehydrated     [employer] is a virtual mental health platform available to our patients   We can refer you to a local mental health provider or you can refer yourself to this online platform using the link below  [email or web address]

## 2023-02-18 ENCOUNTER — Telehealth: Payer: Self-pay | Admitting: Licensed Clinical Social Worker

## 2023-02-18 NOTE — Telephone Encounter (Signed)
 [facility] contacted patient on this date to provide [facility] intro and to schedule a BH appointment. [facility] left a VM.

## 2023-04-13 ENCOUNTER — Ambulatory Visit (HOSPITAL_COMMUNITY): Admission: EM | Admit: 2023-04-13 | Discharge: 2023-04-13 | Discharge status: Against Medical Advice

## 2023-04-13 NOTE — ED Notes (Signed)
 Pt left AMA
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
## 2024-03-29 ENCOUNTER — Ambulatory Visit: Admitting: Obstetrics and Gynecology
